# Patient Record
Sex: Female | Born: 1975 | Race: Black or African American | Hispanic: No | Smoking: Never smoker
Health system: Southern US, Community
[De-identification: ages and names within clinical notes are randomized; demographics above are authoritative.]

## PROBLEM LIST (undated history)

## (undated) DIAGNOSIS — Z9889 Other specified postprocedural states: Secondary | ICD-10-CM

## (undated) DIAGNOSIS — K219 Gastro-esophageal reflux disease without esophagitis: Secondary | ICD-10-CM

## (undated) DIAGNOSIS — R569 Unspecified convulsions: Secondary | ICD-10-CM

## (undated) DIAGNOSIS — R112 Nausea with vomiting, unspecified: Secondary | ICD-10-CM

---

## 1998-10-19 HISTORY — PX: TUBAL LIGATION: SHX77

## 2001-12-19 ENCOUNTER — Encounter: Payer: Self-pay | Admitting: Emergency Medicine

## 2001-12-19 ENCOUNTER — Emergency Department (HOSPITAL_COMMUNITY): Admission: EM | Admit: 2001-12-19 | Discharge: 2001-12-19 | Payer: Self-pay | Admitting: Emergency Medicine

## 2002-04-06 ENCOUNTER — Emergency Department (HOSPITAL_COMMUNITY): Admission: EM | Admit: 2002-04-06 | Discharge: 2002-04-06 | Payer: Self-pay | Admitting: Emergency Medicine

## 2002-06-23 ENCOUNTER — Emergency Department (HOSPITAL_COMMUNITY): Admission: EM | Admit: 2002-06-23 | Discharge: 2002-06-24 | Payer: Self-pay

## 2002-11-06 ENCOUNTER — Other Ambulatory Visit: Admission: RE | Admit: 2002-11-06 | Discharge: 2002-11-06 | Payer: Self-pay | Admitting: Family Medicine

## 2003-02-04 ENCOUNTER — Inpatient Hospital Stay (HOSPITAL_COMMUNITY): Admission: EM | Admit: 2003-02-04 | Discharge: 2003-02-05 | Payer: Self-pay

## 2003-03-01 ENCOUNTER — Emergency Department (HOSPITAL_COMMUNITY): Admission: EM | Admit: 2003-03-01 | Discharge: 2003-03-01 | Payer: Self-pay | Admitting: Emergency Medicine

## 2003-04-09 ENCOUNTER — Encounter (INDEPENDENT_AMBULATORY_CARE_PROVIDER_SITE_OTHER): Payer: Self-pay | Admitting: *Deleted

## 2003-04-09 ENCOUNTER — Ambulatory Visit (HOSPITAL_BASED_OUTPATIENT_CLINIC_OR_DEPARTMENT_OTHER): Admission: RE | Admit: 2003-04-09 | Discharge: 2003-04-09 | Payer: Self-pay | Admitting: Otolaryngology

## 2003-07-23 ENCOUNTER — Emergency Department (HOSPITAL_COMMUNITY): Admission: EM | Admit: 2003-07-23 | Discharge: 2003-07-24 | Payer: Self-pay | Admitting: Emergency Medicine

## 2003-07-23 ENCOUNTER — Encounter: Payer: Self-pay | Admitting: Emergency Medicine

## 2003-11-05 ENCOUNTER — Ambulatory Visit (HOSPITAL_COMMUNITY): Admission: RE | Admit: 2003-11-05 | Discharge: 2003-11-05 | Payer: Self-pay | Admitting: Family Medicine

## 2004-10-10 ENCOUNTER — Emergency Department (HOSPITAL_COMMUNITY): Admission: EM | Admit: 2004-10-10 | Discharge: 2004-10-11 | Payer: Self-pay | Admitting: Emergency Medicine

## 2004-11-04 ENCOUNTER — Emergency Department (HOSPITAL_COMMUNITY): Admission: EM | Admit: 2004-11-04 | Discharge: 2004-11-04 | Payer: Self-pay | Admitting: Emergency Medicine

## 2004-11-12 ENCOUNTER — Ambulatory Visit: Admission: RE | Admit: 2004-11-12 | Discharge: 2004-11-12 | Payer: Self-pay | Admitting: Family Medicine

## 2004-12-10 ENCOUNTER — Emergency Department (HOSPITAL_COMMUNITY): Admission: EM | Admit: 2004-12-10 | Discharge: 2004-12-10 | Payer: Self-pay | Admitting: Emergency Medicine

## 2005-01-25 ENCOUNTER — Emergency Department (HOSPITAL_COMMUNITY): Admission: EM | Admit: 2005-01-25 | Discharge: 2005-01-25 | Payer: Self-pay | Admitting: Emergency Medicine

## 2005-03-20 ENCOUNTER — Inpatient Hospital Stay (HOSPITAL_COMMUNITY): Admission: AD | Admit: 2005-03-20 | Discharge: 2005-03-20 | Payer: Self-pay | Admitting: Obstetrics and Gynecology

## 2005-04-09 ENCOUNTER — Encounter (INDEPENDENT_AMBULATORY_CARE_PROVIDER_SITE_OTHER): Payer: Self-pay | Admitting: Specialist

## 2005-04-09 ENCOUNTER — Ambulatory Visit (HOSPITAL_COMMUNITY): Admission: RE | Admit: 2005-04-09 | Discharge: 2005-04-09 | Payer: Self-pay | Admitting: Obstetrics and Gynecology

## 2005-04-14 ENCOUNTER — Inpatient Hospital Stay (HOSPITAL_COMMUNITY): Admission: AD | Admit: 2005-04-14 | Discharge: 2005-04-14 | Payer: Self-pay | Admitting: Obstetrics and Gynecology

## 2006-01-28 ENCOUNTER — Ambulatory Visit (HOSPITAL_COMMUNITY): Admission: RE | Admit: 2006-01-28 | Discharge: 2006-01-28 | Payer: Self-pay | Admitting: Neurology

## 2006-02-04 ENCOUNTER — Emergency Department (HOSPITAL_COMMUNITY): Admission: EM | Admit: 2006-02-04 | Discharge: 2006-02-04 | Payer: Self-pay | Admitting: Emergency Medicine

## 2006-05-18 ENCOUNTER — Emergency Department (HOSPITAL_COMMUNITY): Admission: EM | Admit: 2006-05-18 | Discharge: 2006-05-18 | Payer: Self-pay | Admitting: Emergency Medicine

## 2006-10-19 HISTORY — PX: BRAIN SURGERY: SHX531

## 2006-11-26 ENCOUNTER — Ambulatory Visit (HOSPITAL_COMMUNITY): Admission: RE | Admit: 2006-11-26 | Discharge: 2006-11-26 | Payer: Self-pay | Admitting: Emergency Medicine

## 2006-12-31 ENCOUNTER — Encounter: Admission: RE | Admit: 2006-12-31 | Discharge: 2007-01-27 | Payer: Self-pay | Admitting: Psychiatry

## 2007-03-27 ENCOUNTER — Emergency Department (HOSPITAL_COMMUNITY): Admission: EM | Admit: 2007-03-27 | Discharge: 2007-03-28 | Payer: Self-pay | Admitting: Emergency Medicine

## 2007-04-08 ENCOUNTER — Emergency Department (HOSPITAL_COMMUNITY): Admission: EM | Admit: 2007-04-08 | Discharge: 2007-04-08 | Payer: Self-pay | Admitting: Emergency Medicine

## 2007-04-13 ENCOUNTER — Emergency Department (HOSPITAL_COMMUNITY): Admission: EM | Admit: 2007-04-13 | Discharge: 2007-04-13 | Payer: Self-pay | Admitting: Emergency Medicine

## 2007-05-22 IMAGING — CR DG ELBOW COMPLETE 3+V*L*
4 series · 4 of 4 positions shown · non-contrast
Comparison: None.

CLINICAL DATA: Fall after seizure.
 LEFT ELBOW ? 4 VIEW:

[view not recorded (1 of 4)]
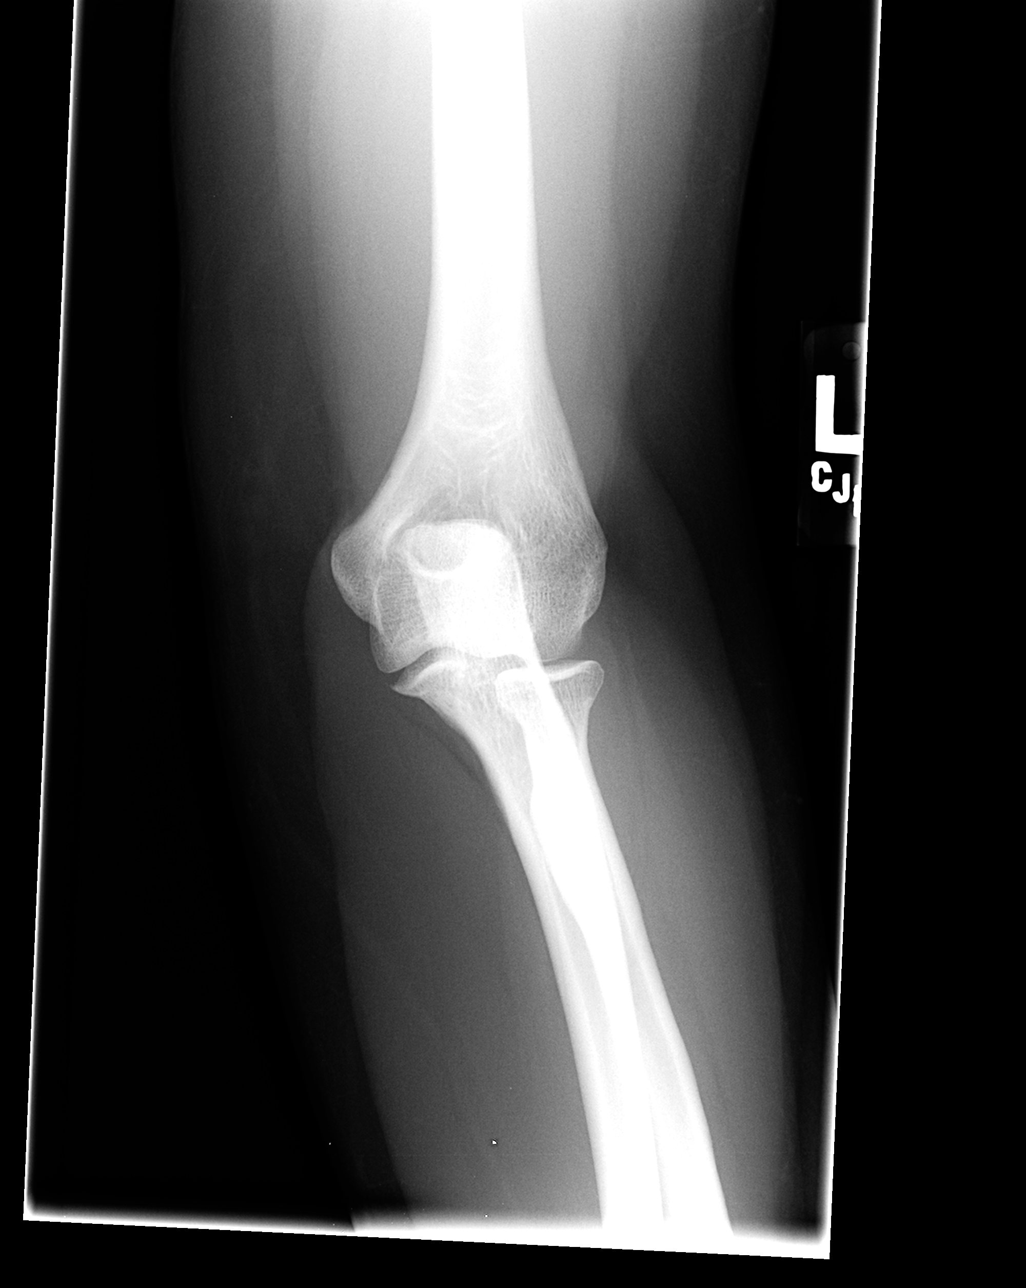

[view not recorded (2 of 4)]
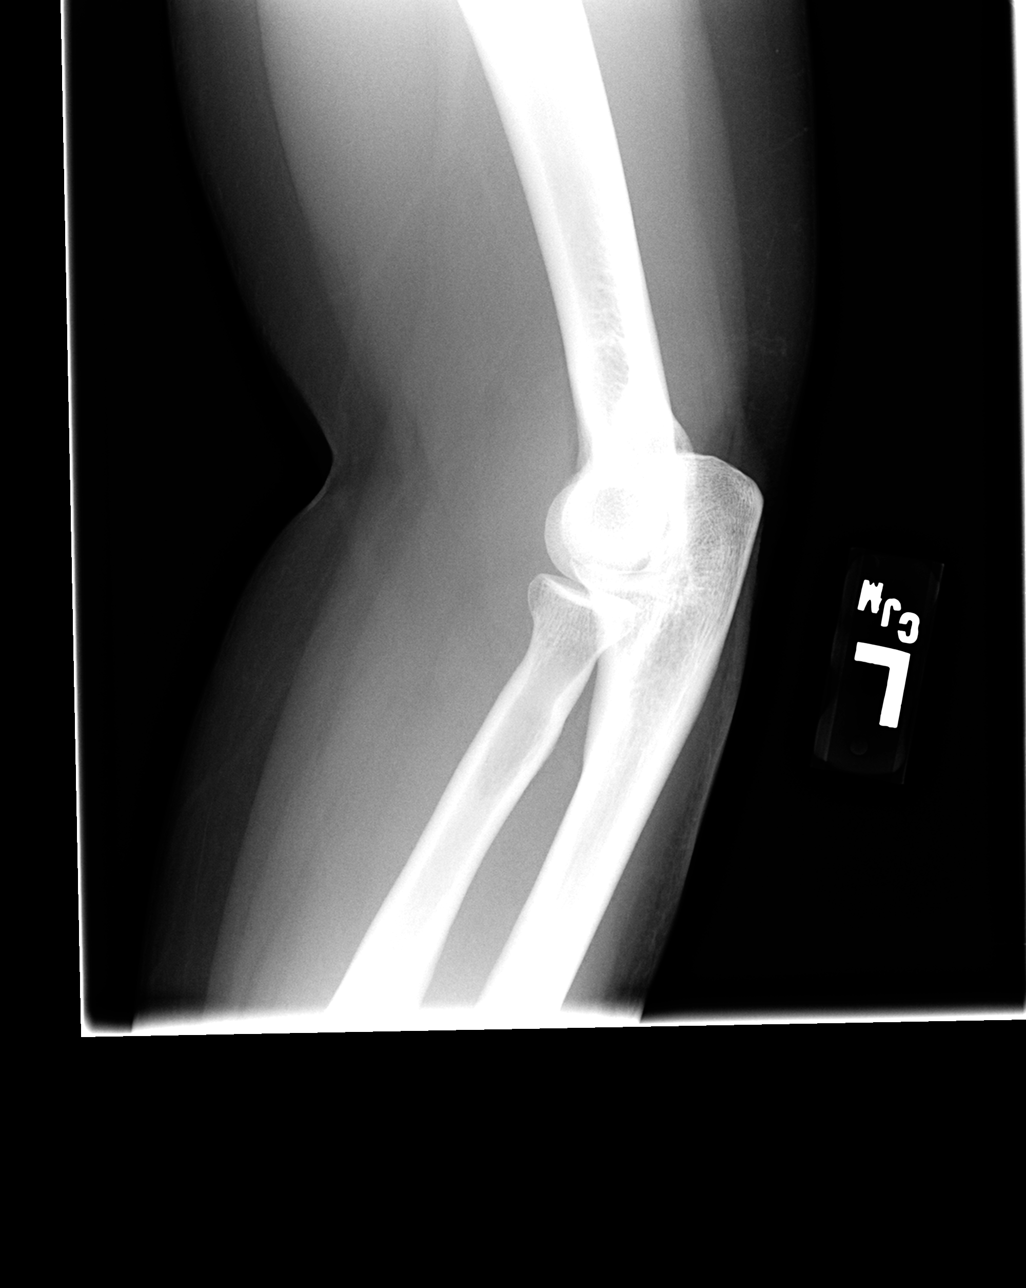

[view not recorded (3 of 4)]
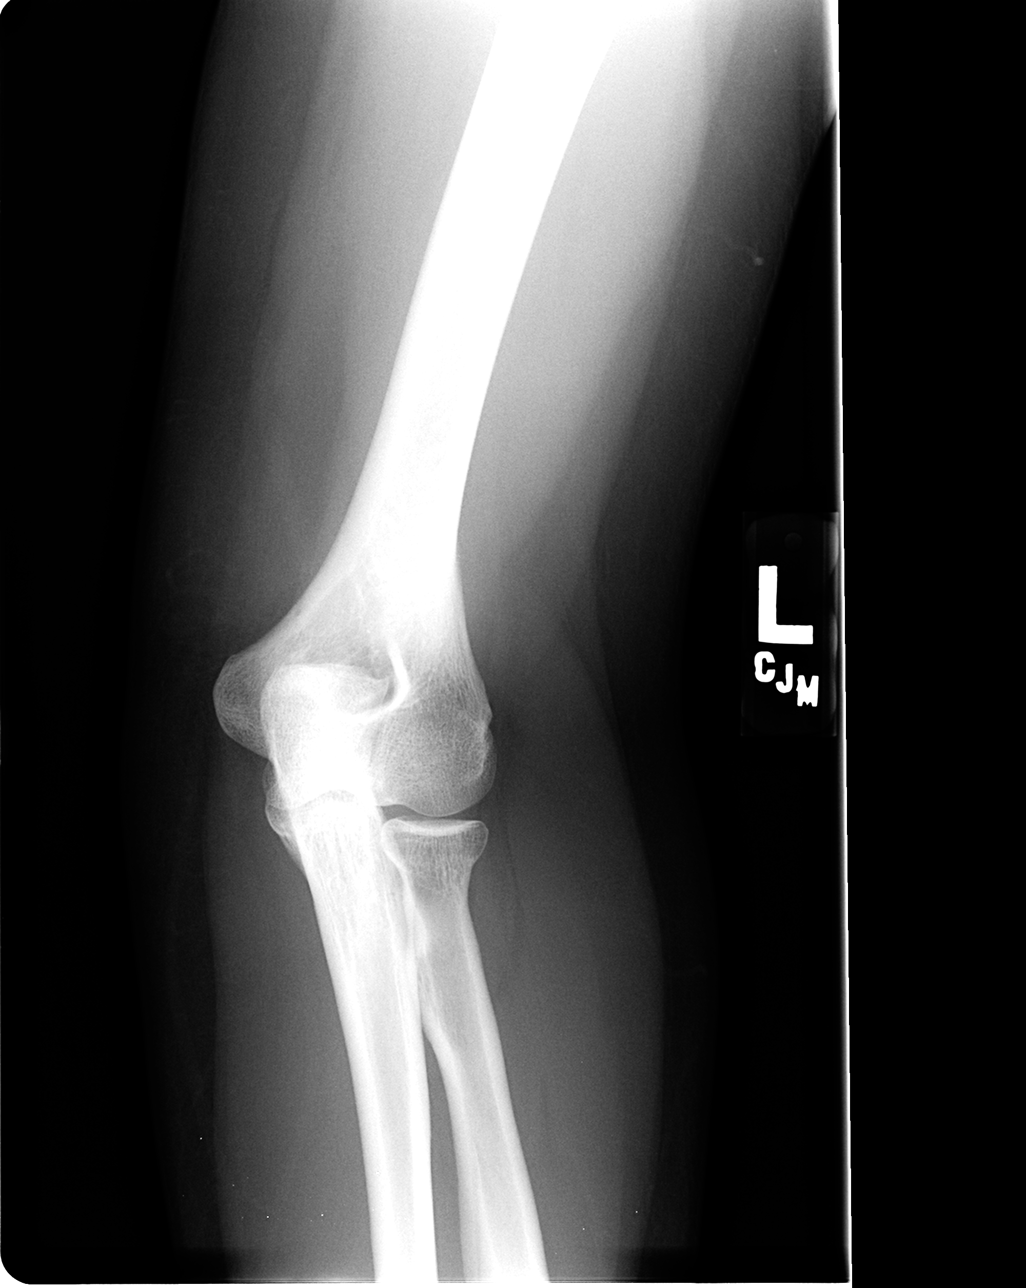

[view not recorded (4 of 4)]
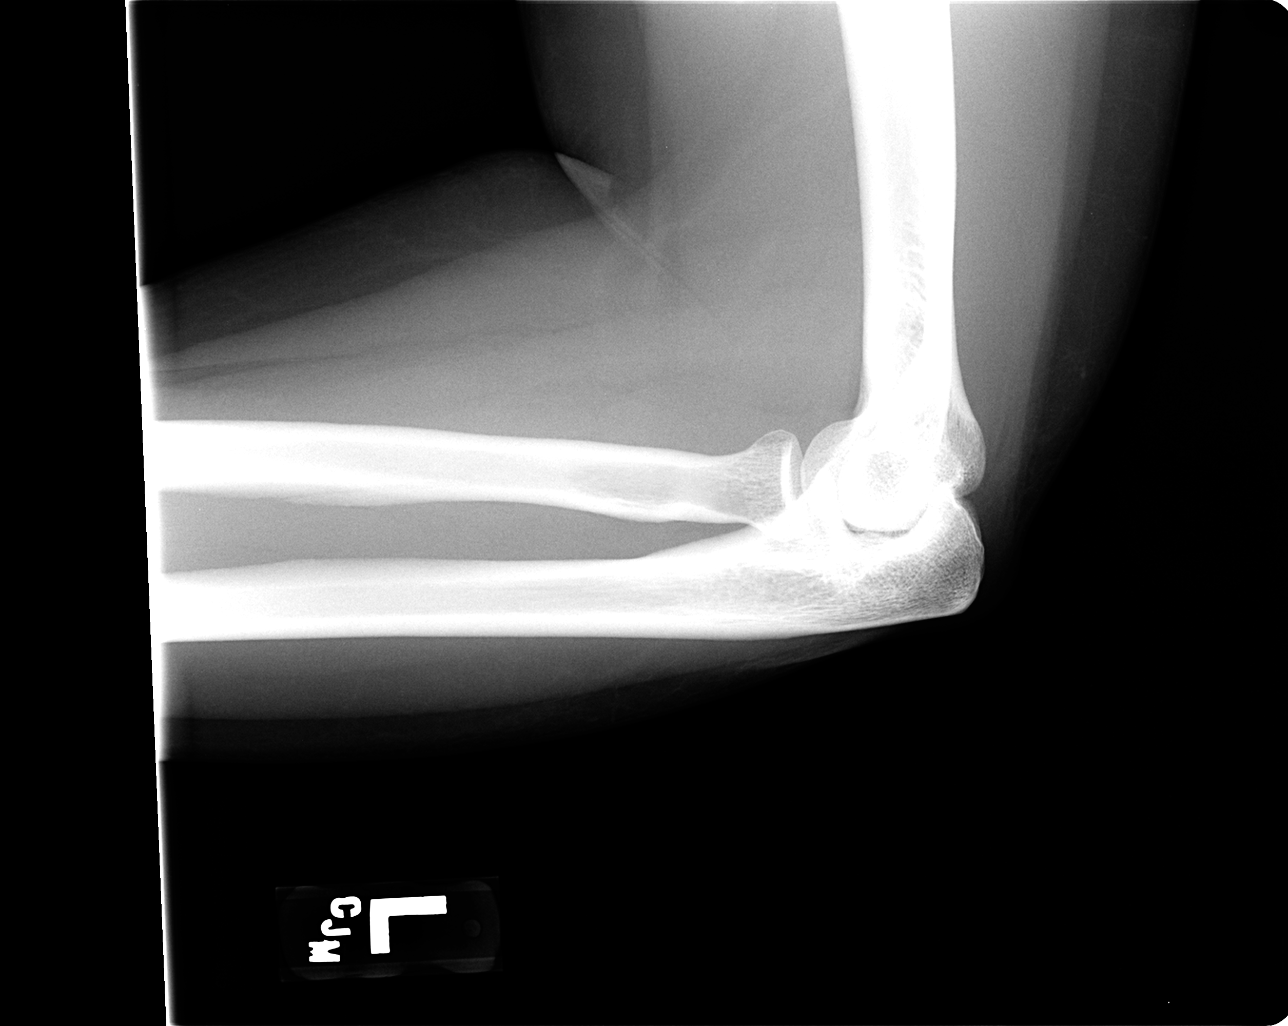

[4 of 4 positions shown; findings below may reference images not displayed]

FINDINGS: No joint effusion is identified.  
 There are no fractures or dislocations.
IMPRESSION: No acute findings.

## 2007-05-22 IMAGING — CR DG WRIST COMPLETE 3+V*L*
4 series · 4 of 4 positions shown · non-contrast
Comparison: none

CLINICAL DATA: Status post fall after a seizure.  Evaluate for fracture. 
LEFT WRIST - U4VYS:
COMPARISION: None

[view not recorded (1 of 4)]
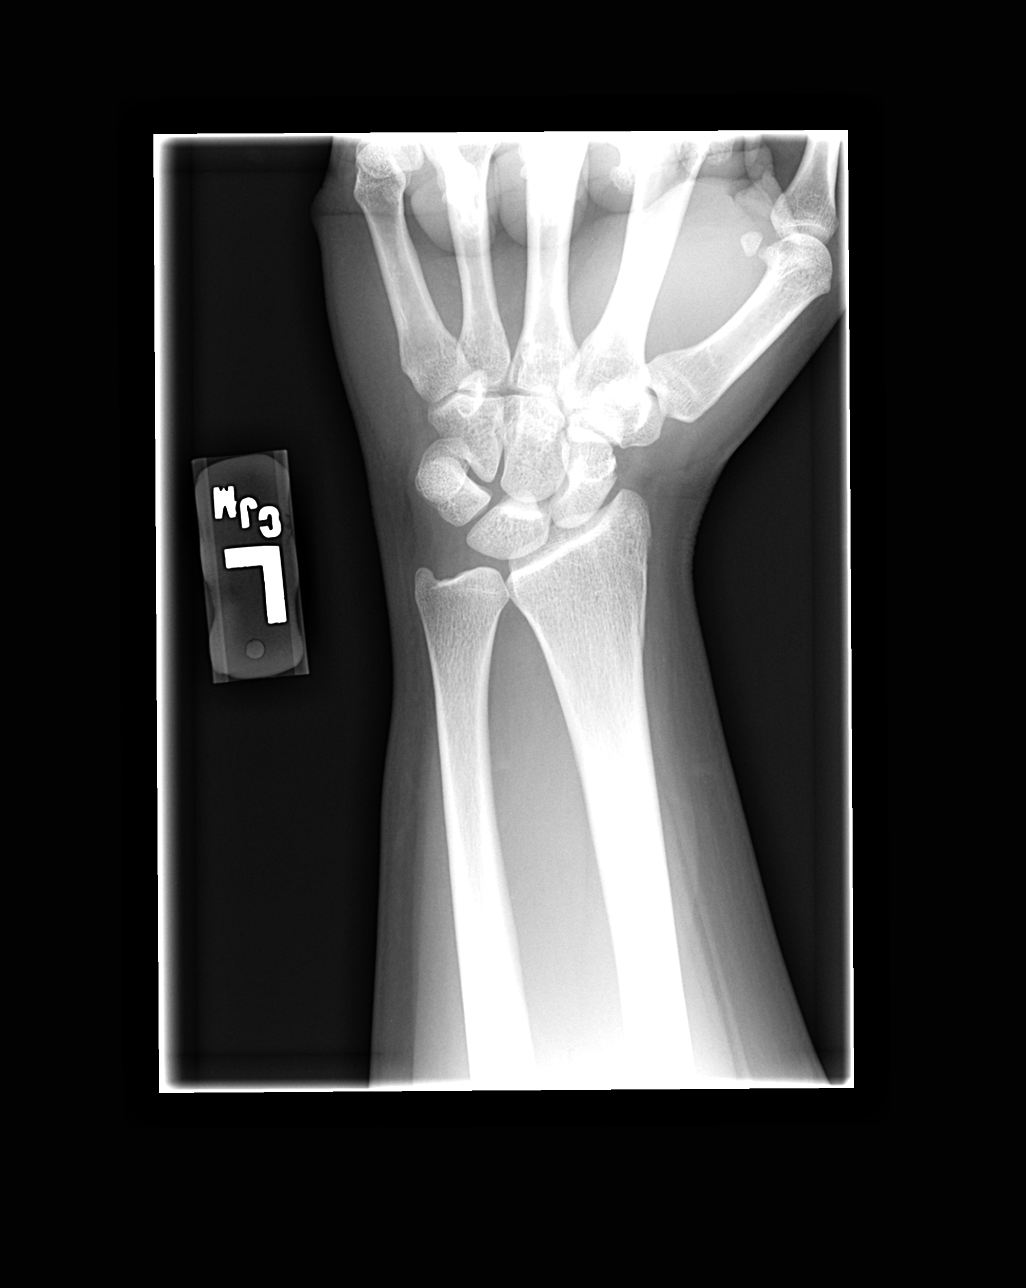

[view not recorded (2 of 4)]
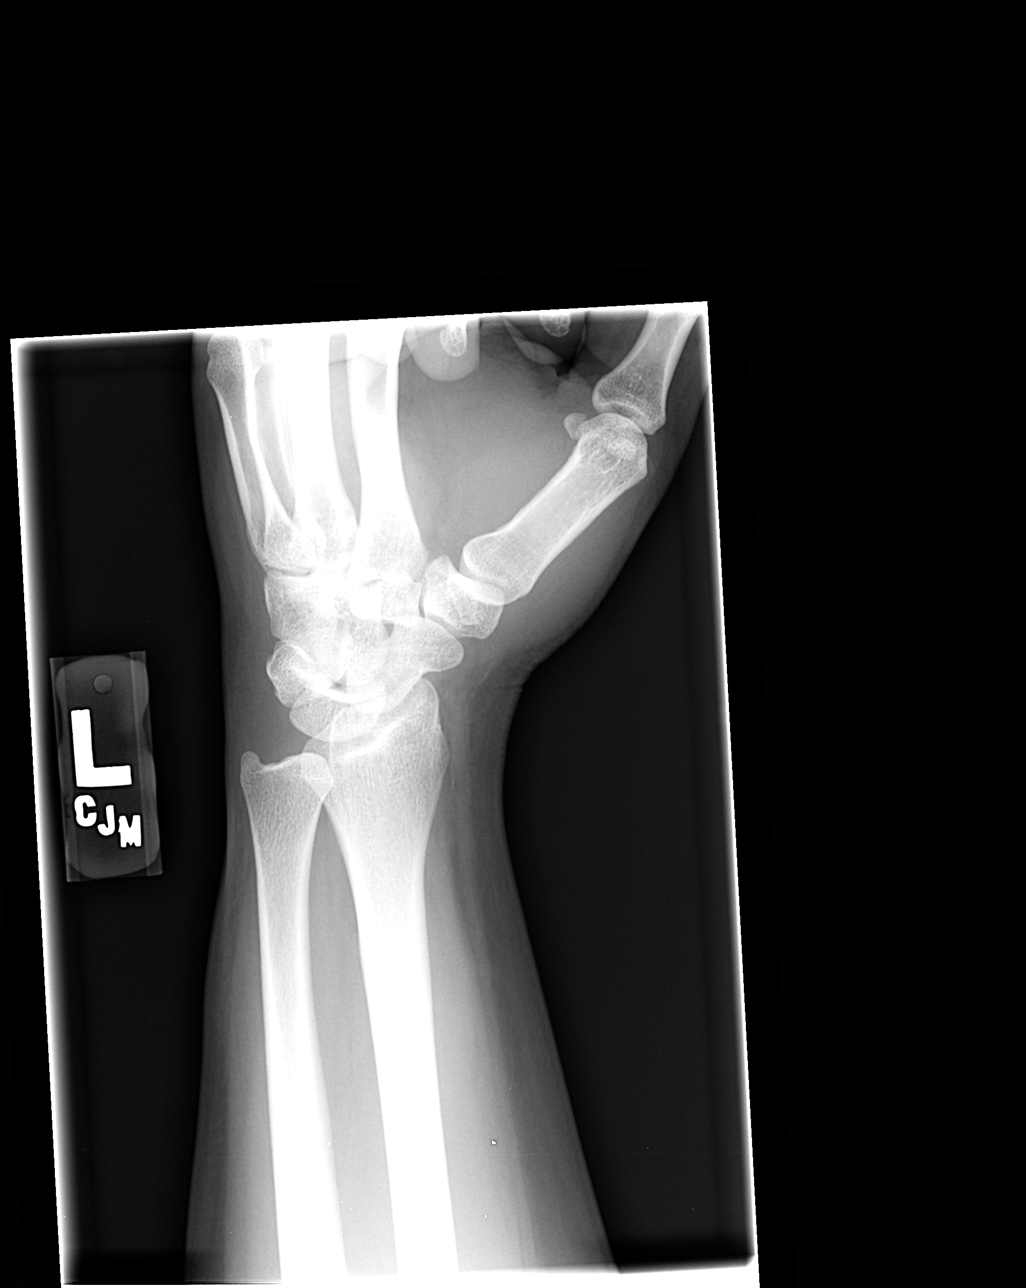

[view not recorded (3 of 4)]
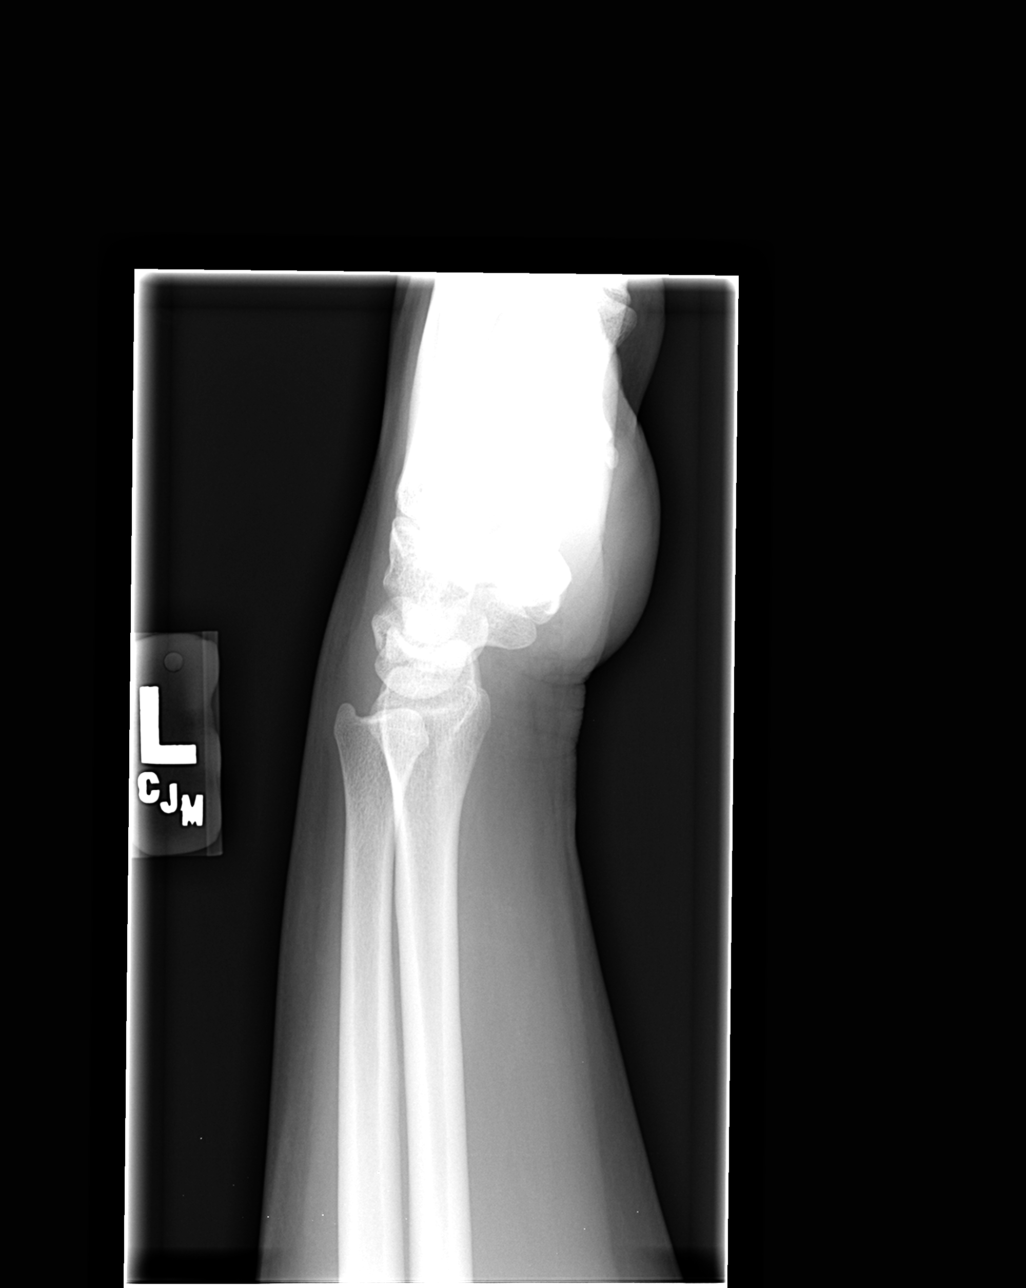

[view not recorded (4 of 4)]
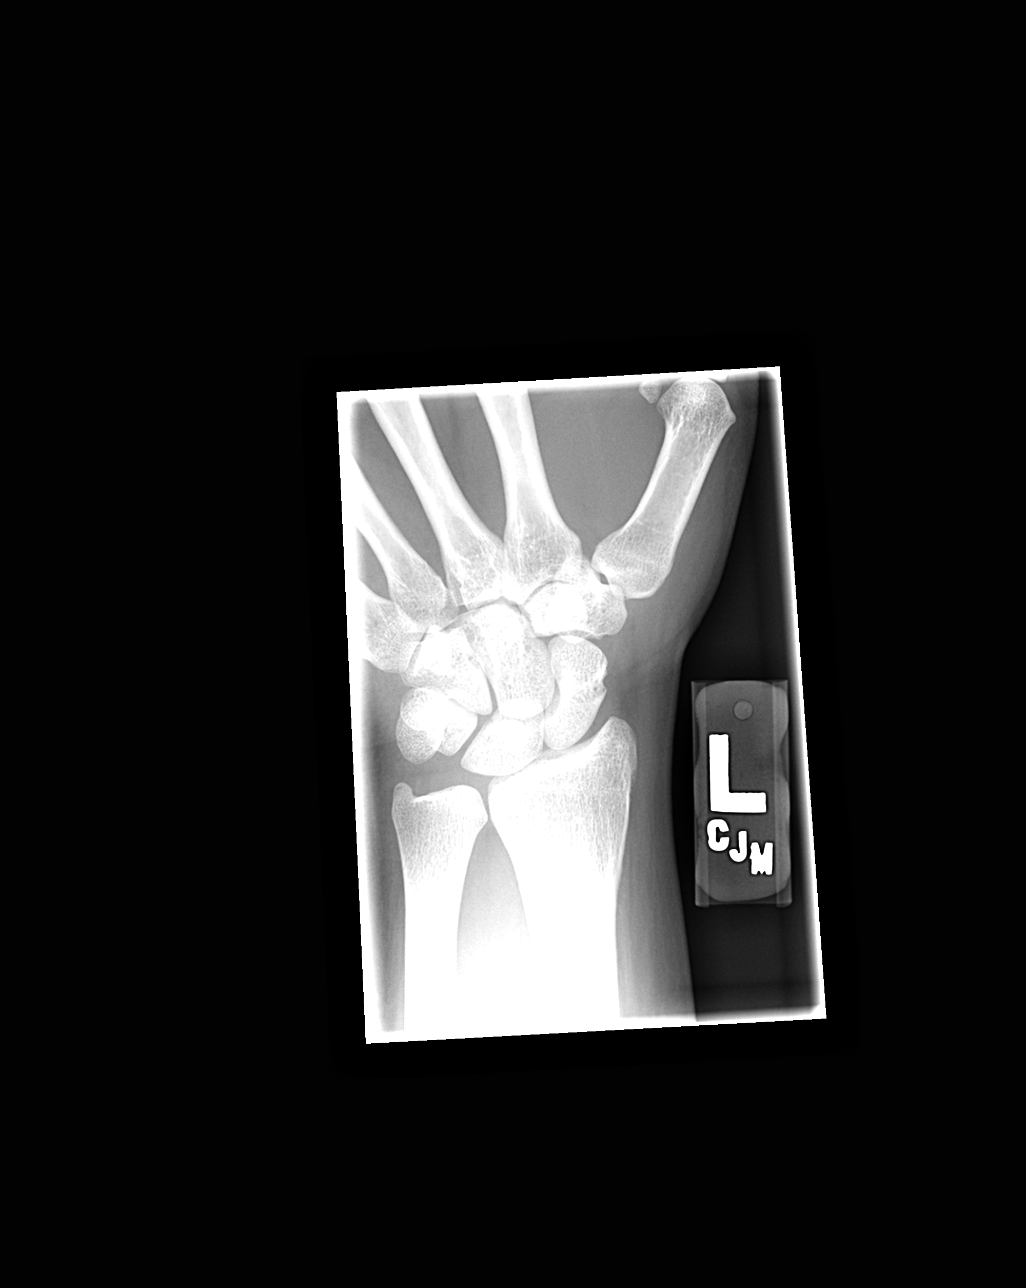

[4 of 4 positions shown; findings below may reference images not displayed]

FINDINGS: No fractures or dislocations are identified.
IMPRESSION: No acute findings.

## 2008-06-21 ENCOUNTER — Encounter: Admission: RE | Admit: 2008-06-21 | Discharge: 2008-08-09 | Payer: Self-pay | Admitting: Neurology

## 2009-09-01 ENCOUNTER — Emergency Department (HOSPITAL_COMMUNITY): Admission: EM | Admit: 2009-09-01 | Discharge: 2009-09-01 | Payer: Self-pay | Admitting: Emergency Medicine

## 2010-04-24 ENCOUNTER — Ambulatory Visit (HOSPITAL_COMMUNITY): Admission: RE | Admit: 2010-04-24 | Discharge: 2010-04-24 | Payer: Self-pay | Admitting: Obstetrics and Gynecology

## 2010-11-08 ENCOUNTER — Encounter: Payer: Self-pay | Admitting: Family Medicine

## 2010-11-09 ENCOUNTER — Encounter: Payer: Self-pay | Admitting: Emergency Medicine

## 2011-01-04 LAB — CBC
HCT: 34.5 % — ABNORMAL LOW (ref 36.0–46.0)
MCH: 30.9 pg (ref 26.0–34.0)
RBC: 3.83 MIL/uL — ABNORMAL LOW (ref 3.87–5.11)
WBC: 5.9 10*3/uL (ref 4.0–10.5)

## 2011-01-21 LAB — URINALYSIS, ROUTINE W REFLEX MICROSCOPIC
Bilirubin Urine: NEGATIVE
Hgb urine dipstick: NEGATIVE
Ketones, ur: NEGATIVE mg/dL
Protein, ur: NEGATIVE mg/dL
Specific Gravity, Urine: 1.019 (ref 1.005–1.030)

## 2011-01-21 LAB — POCT I-STAT, CHEM 8
HCT: 36 % (ref 36.0–46.0)
Hemoglobin: 12.2 g/dL (ref 12.0–15.0)
Sodium: 138 mEq/L (ref 135–145)

## 2011-01-21 LAB — URINE MICROSCOPIC-ADD ON

## 2011-03-06 NOTE — Op Note (Signed)
NAME:  Leslie Zhang, Leslie Zhang                      ACCOUNT NO.:  [id number]   MEDICAL RECORD NO.:  [id number]                   PATIENT TYPE:  AMB   LOCATION:  DSC                                  FACILITY:  [facility]   PHYSICIAN:  Kinnie Scales. Annalee Genta, M.D.            DATE OF BIRTH:  1975/11/02   DATE OF PROCEDURE:  04/09/2003  DATE OF DISCHARGE:                                 OPERATIVE REPORT   PREOPERATIVE DIAGNOSES:  1. Recurrent acute tonsillitis.  2. History of peritonsillar abscess.   POSTOPERATIVE DIAGNOSES:  1. Recurrent acute tonsillitis.  2. History of peritonsillar abscess.   INDICATIONS FOR PROCEDURE:  1. Recurrent acute tonsillitis.  2. History of peritonsillar abscess.   SURGICAL PROCEDURE:  Tonsillectomy.   ANESTHESIA:  General endotracheal.   COMPLICATIONS:  None.   ESTIMATED BLOOD LOSS:  Minimal.   CONDITION ON DISCHARGE:  The patient transferred from the operating room to  the recovery room in stable condition.   BRIEF HISTORY:  The patient is a 35 year old black female who is referred  from the [facility] Department for evaluation of  recurrent tonsillitis.  The patient had a history of significant tonsillar  infections requiring hospitalization twice for intravenous antibiotics and  hydration.  The patient has had a history of significant recurrent  infections and chronic symptoms of low grade sore throat and tonsillar  discharge.  Due to the patient's history, examination and findings, I  recommended to undertake tonsillectomy under general anesthesia.  The risks  and benefits and possible complications of the procedure were discussed in  detail with the patient and her family who understood and concurred with our  plan for surgery which was scheduled for 04/09/03.   DESCRIPTION OF PROCEDURE:  The patient was brought to the operating room on  04/09/03 and placed in the supine position on the operating room table.  General endotracheal  anesthesia was induced without difficulty.  When the  patient had been adequately anesthetized, a Clevis mouth gag was inserted  and the oral cavity and oropharynx were examined.  The patient's hard and  soft palate were intact.  The patient had significant bilateral tonsillary  hypertrophy with chronic cryptic tonsillar changes.  Beginning on the  patient's left hand side using a harmonics scalpel and dissecting in a  subcapsular fashion, the entire left tonsil was resected from the superior  pole to the tongue base.  The right tonsil was removed in a similar fashion.  The tonsillar tissue was sent to the laboratory for gross and microscopic  evaluation.  The tonsillar fossa were gently abraded with the dry tonsil  sponge and several areas of point hemorrhage were cauterized using suction  cautery.  The clevis mouth gag was released and re-applied.  There was no  active bleeding.  The orogastric tube was passed and the stomach contents  were aspirated.  The clevis mouth gag was released and removed.  There  were  no loose or broken teeth and no bleeding.  The patient was then awakened  from her anesthetic.  She was extubated, was transferred from the operating  room to the recovery room in stable condition.   COMPLICATIONS:  None.   ESTIMATED BLOOD LOSS:  Minimal.                                                David L. Annalee Genta, M.D.    DLS/MEDQ  D:  10/21/7251  T:  04/09/2003  Job:  [id number]

## 2011-03-06 NOTE — Discharge Summary (Signed)
NAME:  Leslie Zhang, Leslie Zhang NO.:  [id number]   MEDICAL RECORD NO.:  [id number]                   PATIENT TYPE:  INP   LOCATION:  0467                                 FACILITY:  [facility]   PHYSICIAN:  Lazaro Arms, M.D.        DATE OF BIRTH:  [birth year]   DATE OF ADMISSION:  02/04/2003  DATE OF DISCHARGE:                                 DISCHARGE SUMMARY   PRIMARY CARE PHYSICIAN:  Dr. Daphine Deutscher.  Phone number is [phone].  Fax number  unknown.   DISCHARGE DIAGNOSES:  1. Strep pharyngitis, recurrent.  2. Status post dehydration, now resolved.   HISTORY OF PRESENT ILLNESS:  The patient was in her usual state of health  until approximately four days prior to admission when she began to have a  sore throat and fever.  This worsened progressively and then she became  unable to swallow anything including her own secretions.  She also admitted  to nausea, vomiting, and unable to take p.o.  She did state her voice  sounded funny the previous two days prior to admission.  All of this is  similar to her other three episodes of Strep pharyngitis which she has had  over the past several years.  Most recently she had one in September of 2003  where she had to come to the emergency room for rehydration.  She has no  other medical problems.   ALLERGIES:  She has no allergies.   SOCIAL HISTORY:  She does live with her two children and is currently  unemployed.   FAMILY HISTORY:  Significant for mom with hypertension.   HOSPITAL COURSE:  Initially on presentation she was dehydrated with a pulse  of 138 and temperature was 99.4.  She was admitted to the emergency room,  gave her a shot of benzylpenicillin, gave her a shot of IV Decadron and some  fluids and she was admitted because she could no longer keep down p.o.  despite the above measures.  We continued attention to IV Clindamycin and  watched her closely, as we were initially worried that she might be  developing an abscess.  However, she improved dramatically over the next 24  hours and she was able to swallow.  The voice returned to normal and she at  no point had any trismus, and she was discharged home in stable condition on  February 05, 2003.  She had taken a full lunch and had been able to drink and  was feeling markedly improved.   PHYSICAL EXAMINATION:  VITAL SIGNS:  Pulse was 80, blood pressure was  119/67.  GENERAL:  Her exam was normal.  HEENT:  Her tonsils were still enlarged, although they were perhaps more so  than before.   DISCHARGE PLAN:   FOLLOW UP:  She is to follow up with her primary care doctor.  She has an  appointment tomorrow with Dr. Daphine Deutscher.    DISCHARGE MEDICATIONS:  She was given  a prescription for __________10 day  course of antibiotics.  Augmentin was chosen as she did have a very tender  lymph node.  Her primary care doctor should follow up that with __________to  make sure this resolves.  In addition the patient was counseled to ask her  primary care doctor about the pros and cons of tonsillectomy, as she has had  recurrent pharyngitis which is rather severe.                                                  Lazaro Arms, M.D.    AMC/MEDQ  D:  02/05/2003  T:  02/05/2003  Job:  [id number]   cc:   Dr. Daphine Deutscher

## 2011-03-06 NOTE — Op Note (Signed)
NAMEARELY, TINNER NO.:  [id number]   MEDICAL RECORD NO.:  [id number]          PATIENT TYPE:  AMB   LOCATION:  SDC                           FACILITY:  [facility]   PHYSICIAN:  Maxie Better, M.D.DATE OF BIRTH:  1976-03-30   DATE OF PROCEDURE:  04/09/2005  DATE OF DISCHARGE:                                 OPERATIVE REPORT   PREOPERATIVE DIAGNOSES:  1.  Chronic left lower quadrant pain.  2.  Menorrhagia.  3.  Endometrial mass.   PROCEDURE:  1.  Diagnostic laparoscopy.  2.  Diagnostic hysteroscopy.  3.  Dilation and curettage.   POSTOPERATIVE DIAGNOSES:  1.  Chronic left lower quadrant pain.  2.  Menorrhagia.  3.  Endometrial mass.   ANESTHESIA:  General, paracervical block.   SURGEON:  Maxie Better, M.D.   ASSISTANTS:  None.   DESCRIPTION OF PROCEDURE:  Under adequate general anesthesia, the patient  was placed in the dorsolithotomy position.  She was sterilely prepped and  draped in the usual fashion.  Indwelling Foley catheter is sterilely placed.  Examination under anesthesia reveals bulky, retroverted, retroflexed uterus.  No adnexal masses could be appreciated.  A bivalve speculum was placed in  the vagina.  Single-tooth tenaculum was placed on the anterior lip of the  cervix.  Then 10 mL of 1% Nesacaine was injected paracervically.  The cervix  was then serially dilated up to a #31 Pratt dilator.  A sorbitol primed  resectoscope was introduced into the uterine cavity without incident.  Both  tubal ostia could be seen.  The endometrium was thickened, in particular  posteriorly and quite irregular circumferentially.  The posterior thickening  was resected.  The resectoscope was then removed.  The cavity was then  curetted of a large amount of tissue.  The resectoscope was then reinserted,  cavity inspected, additional small areas of prominence and thickening was  removed.  The resectoscope was then removed.  The endometrial cavity was  then curetted.  An acorn cannula was introduced into the cervical os and  attached to the tenaculum for manipulation of the uterus.  A bivalve  speculum was removed.  Attention was then turned to the abdomen.  Marcaine  0.25% was injected infraumbilically.  Infraumbilical incision was then made.  The Veress needle was inserted and tested with normal saline.  With  placement, 3 L of CO2 was insufflated.  The Veress needle was removed, and  10 mm disposable trocar was introduced into the incision without incident.  A lighted video laparoscope was then utilized to inspect the abdomen and  pelvis.  The patient was placed in Trendelenburg.  A suprapubic incision was  then made under direct visualization.  A 5 mm port was placed.  The probe  was then utilized to inspect the pelvis.  The uterus was bulky, retroverted.  Some yellowish, clear fluid was in the posterior cul-de-sac.  Otherwise, the  cul-de-sac was without any evidence of endometriosis.  The left tube was  noted to have previous evidence of tubal ligation.  The left ovary was  normal.  No adhesions were noted.  There were  a large amount of varicosities  in the left ovarian vessel area, much more prominent than on the right.  The  right tube also had a surgical separation.  The right ovary was normal.  The  left ovary appeared to be with a corpus luteal cyst.  The appendix was not  fully visualized.  The right upper quadrant was normal.  The procedure was  terminated by removing the suprapubic port under direct visualization.  The  abdomen was deflated.  The infraumbilical port was removed, taking care not  to bring up an underlying structure.  The fascia was identified and closed  with 0 Vicryl of figure-of-eight suture, and the skin was approximated  infraumbilically with 4-0 Vicryl subcuticular stitch.  The suprapubic area  was approximated using Dermabond.  All instruments were then removed from  the vagina.  The specimen was  endometrial curettings sent to pathology.  Estimated blood loss was minimal.  Complication was none.  The patient  tolerated the procedure well and was transferred to recovery in stable  condition.       [place]/MEDQ  D:  04/09/2005  T:  04/09/2005  Job:  [id number]

## 2011-08-05 ENCOUNTER — Encounter (HOSPITAL_COMMUNITY): Payer: Self-pay

## 2011-08-05 ENCOUNTER — Inpatient Hospital Stay (HOSPITAL_COMMUNITY): Payer: Medicare Other

## 2011-08-05 ENCOUNTER — Inpatient Hospital Stay (HOSPITAL_COMMUNITY)
Admission: AD | Admit: 2011-08-05 | Discharge: 2011-08-05 | Disposition: A | Discharge status: Home / Self Care | Payer: Medicare Other | Source: Ambulatory Visit | Attending: Obstetrics and Gynecology | Admitting: Obstetrics and Gynecology

## 2011-08-05 DIAGNOSIS — R1032 Left lower quadrant pain: Secondary | ICD-10-CM | POA: Insufficient documentation

## 2011-08-05 DIAGNOSIS — N946 Dysmenorrhea, unspecified: Secondary | ICD-10-CM | POA: Insufficient documentation

## 2011-08-05 DIAGNOSIS — IMO0002 Reserved for concepts with insufficient information to code with codable children: Secondary | ICD-10-CM | POA: Insufficient documentation

## 2011-08-05 HISTORY — DX: Nausea with vomiting, unspecified: Z98.890

## 2011-08-05 HISTORY — DX: Unspecified convulsions: R56.9

## 2011-08-05 HISTORY — DX: Other specified postprocedural states: R11.2

## 2011-08-05 LAB — CBC
HCT: 34.7 % — ABNORMAL LOW (ref 36.0–46.0)
Hemoglobin: 11.5 g/dL — ABNORMAL LOW (ref 12.0–15.0)
MCH: 29.1 pg (ref 26.0–34.0)
MCV: 87.8 fL (ref 78.0–100.0)
Platelets: 346 10*3/uL (ref 150–400)
RDW: 12.2 % (ref 11.5–15.5)

## 2011-08-05 LAB — URINALYSIS, ROUTINE W REFLEX MICROSCOPIC
Nitrite: NEGATIVE
Protein, ur: NEGATIVE mg/dL
Specific Gravity, Urine: 1.02 (ref 1.005–1.030)

## 2011-08-05 NOTE — ED Provider Notes (Signed)
History   Chronic LLQ pain with exacebation  Chief Complaint  Patient presents with  . Abdominal Pain   HPI See Dictated note  Pertinent Gynecological History: Menses: flow is moderate Bleeding: nl Contraception: tubal ligation DES exposure: denies Blood transfusions: none Sexually transmitted diseases: no past history Previous GYN Procedures: see note dictated  Last mammogram: normal Date: na Last pap: normal Date: 2011   Past Medical History  Diagnosis Date  . Seizures     last sz 1 yr ago  . PONV (postoperative nausea and vomiting)     Past Surgical History  Procedure Date  . Tubal ligation 2000  . 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     Family History  Problem Relation Age of Onset  . Asthma Mother   . Hypertension Mother   . Asthma Brother   . Hypertension Brother   . Asthma Maternal Grandmother   . Diabetes Maternal Grandmother   . Hypertension Maternal Grandmother   . Diabetes Maternal Grandfather     History  Substance Use Topics  . Smoking status: Never Smoker   . Smokeless tobacco: Not on file  . Alcohol Use: No    Allergies:  Allergies  Allergen Reactions  . Contrast Media (Iodinated Diagnostic Agents) Itching and Rash    Severe itching  . Lamictal (Lamotrigine) Rash    Prescriptions prior to admission  Medication Sig Dispense Refill  . clobetasol (TEMOVATE) 0.05 % cream Apply 1 application topically 2 (two) times daily as needed. For rash       . esomeprazole (NEXIUM) 40 MG capsule Take 40 mg by mouth daily.        Marland Kitchen ibuprofen (ADVIL,MOTRIN) 800 MG tablet Take 800 mg by mouth every 8 (eight) hours as needed. For headache or pain       . levETIRAcetam (KEPPRA) 500 MG tablet Take 1,000 mg by mouth every 12 (twelve) hours.        . magnesium citrate 1.745 GM/30ML SOLN Take 296 mLs by mouth daily as needed. For constipation       . Multiple Vitamins-Minerals (HAIR/SKIN/NAILS PO) Take 1 tablet by mouth daily.        .  Oxcarbazepine (TRILEPTAL) 300 MG tablet Take 450 mg by mouth 2 (two) times daily. Take 1 and 1/2 tabs twice daily         ROS Physical ExamDictated   Blood pressure 128/79, pulse 77, temperature 98.6 F (37 C), temperature source Oral, resp. rate 16, height 5\' 7"  (1.702 m), weight 77.747 kg (171 lb 6.4 oz), last menstrual period 07/29/2011, SpO2 97.00%.  Physical Exam Dictated  MAU Course  Procedures  MDM na  Assessment and Plan  See Dictated note #[id number]  Rashaad Hallstrom J 08/05/2011, 8:07 PM [facility]

## 2011-08-05 NOTE — Progress Notes (Signed)
Pt states lower abdominal pain that is sharp & "reminds her of contractions", started 2 weeks ago. Comes & goes 3-4 times a day, lasts for a minute at a time. Denies vaginal bleeding or vaginal discharge. States the pain is more to the left side.

## 2011-08-06 LAB — DIFFERENTIAL
Basophils Absolute: 0
Basophils Relative: 1
Monocytes Absolute: 0.5
Neutro Abs: 3.1
Neutrophils Relative %: 50

## 2011-08-06 LAB — URINALYSIS, ROUTINE W REFLEX MICROSCOPIC
Bilirubin Urine: NEGATIVE
Leukocytes, UA: NEGATIVE
Nitrite: NEGATIVE
Specific Gravity, Urine: 1.023
Urobilinogen, UA: 1

## 2011-08-06 LAB — BASIC METABOLIC PANEL
BUN: 8
CO2: 25
Calcium: 9.1
Creatinine, Ser: 0.68
GFR calc non Af Amer: >60
Glucose, Bld: 104 — ABNORMAL HIGH

## 2011-08-06 LAB — POCT PREGNANCY, URINE
Operator id: 173591
Preg Test, Ur: NEGATIVE

## 2011-08-06 LAB — CBC
Hemoglobin: 12.7
MCHC: 34.3
Platelets: 364
RDW: 12

## 2011-08-06 LAB — URINE MICROSCOPIC-ADD ON

## 2011-08-06 NOTE — H&P (Signed)
NAMETIERRIA, WATSON NO.:  [id number]  MEDICAL RECORD NO.:  [id number]  LOCATION:  ZO10                          FACILITY:  [facility]  PHYSICIAN:  Lenoard Aden, M.D.DATE OF BIRTH:  08-Dec-1975  DATE OF ADMISSION:  08/05/2011 DATE OF DISCHARGE:  08/05/2011                             HISTORY & PHYSICAL   CHIEF COMPLAINT:  Pelvic pain.  HISTORY OF PRESENT ILLNESS:  She is a 35 year old African American female, G2, P2, with a 3-year history of chronic left lower quadrant pain, who presents with similar complaints this evening, reports that has been exacerbated over the last 2 weeks.  She reports dyspareunia and dysmenorrhea.  She denies any menstrual bleeding.  She denies possibility of pregnancy.  She has a history of tubal ligation.  She has allergies to SEIZURE MEDICATION, IVP DYE, and LAMICTAL.  She is a nonsmoker, nondrinker.  She denies domestic or physical violence.  She has a history of 2 vaginal deliveries by report, history of hysteroscopy in 2009, brain surgery in 2008 for temporal lobe.  Procedure; laparoscopy, which was negative in 2006, tubal ligation, D and C.  MEDICATIONS:  Include labetalol as needed, Trileptal, Keppra, and prenatal vitamins.  She has a family history of hypertension.  REVIEW OF SYSTEMS:  Otherwise negative.  The patient reports questionable constipation for which she took mag citrate with good relief.  She denies any urinary symptomatologies, but does have pelvic pressure as noted.  LABORATORY VALUES:  Today reveal a negative UPT, normal CBC, and negative urinalysis and an ultrasound which reveals a normal anteflexed uterus, small right parovarian cyst, but otherwise normal adnexal structures.  IMPRESSION:  Chronic left lower quadrant pain of unknown etiology.  The patient has been seen in [facility] at [facility] with negative etiology noted for her ongoing intermittent discomfort.  She has continued to  decline any surgical intervention.  She declines any pain medications at this time.  Workup today once again is negative.  Plan will be to discharge home.  She is to follow up in the office of Dr. Cherly Hensen in 1 week or sooner as needed.     Lenoard Aden, M.D.     RJT/MEDQ  D:  08/05/2011  T:  08/06/2011  Job:  [id number]

## 2012-11-26 IMAGING — US US PELVIS COMPLETE
1 series · 14 of 25 positions shown · non-contrast
Comparison: 11/26/2006

CLINICAL DATA: Abdominal pain

TRANSABDOMINAL AND TRANSVAGINAL ULTRASOUND OF PELVIS
TECHNIQUE: Both transabdominal and transvaginal ultrasound
examinations of the pelvis were performed. Transabdominal technique
was performed for global imaging of the pelvis including uterus,
ovaries, adnexal regions, and pelvic cul-de-sac.

[Series 1: us pelvis complete · 14 of 57 slices shown]
[im 1/57]
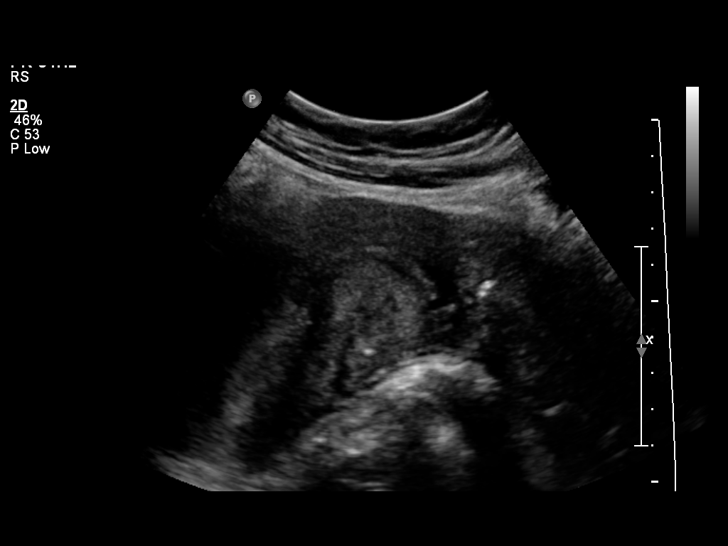
[im 5/57]
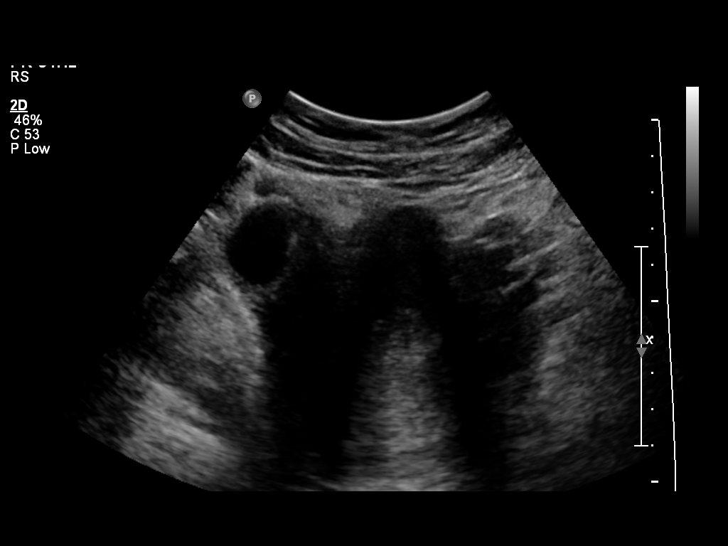
[im 10/57]
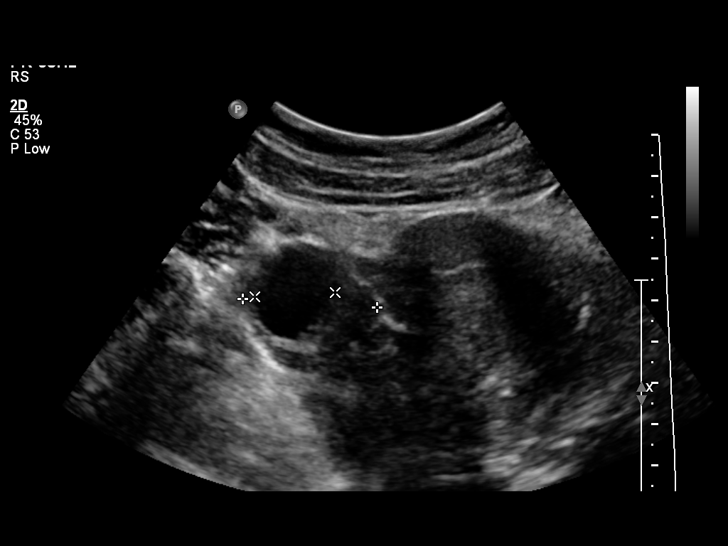
[im 15/57]
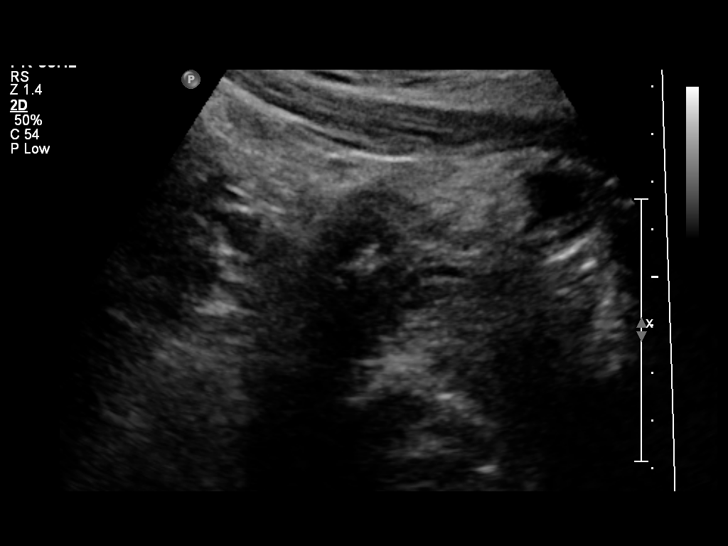
[im 19/57]
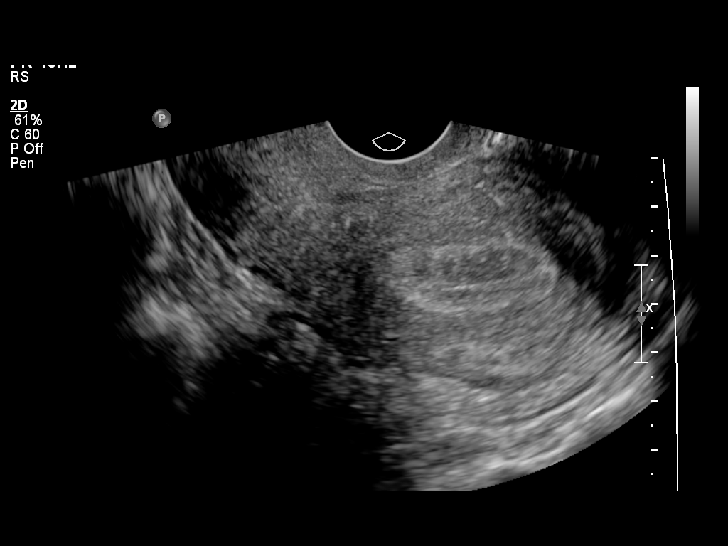
[im 22/57]
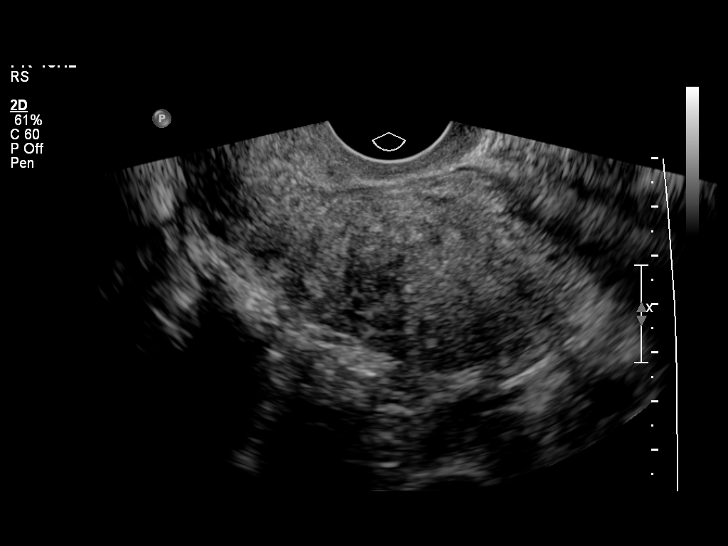
[im 26/57]
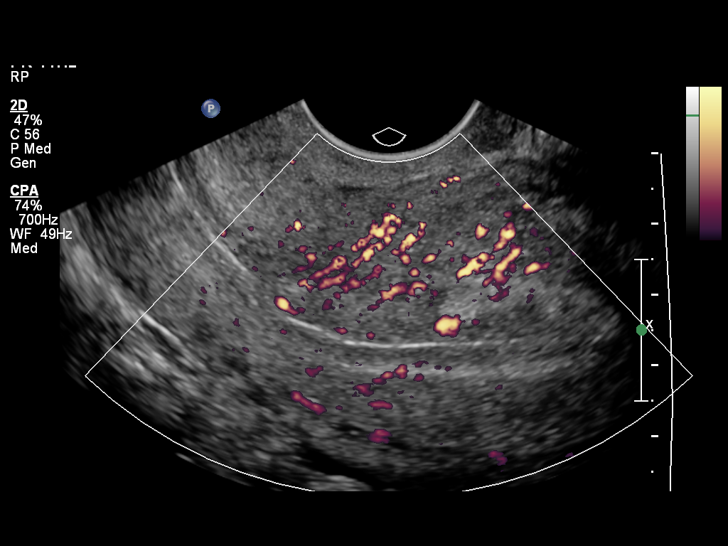
[im 31/57]
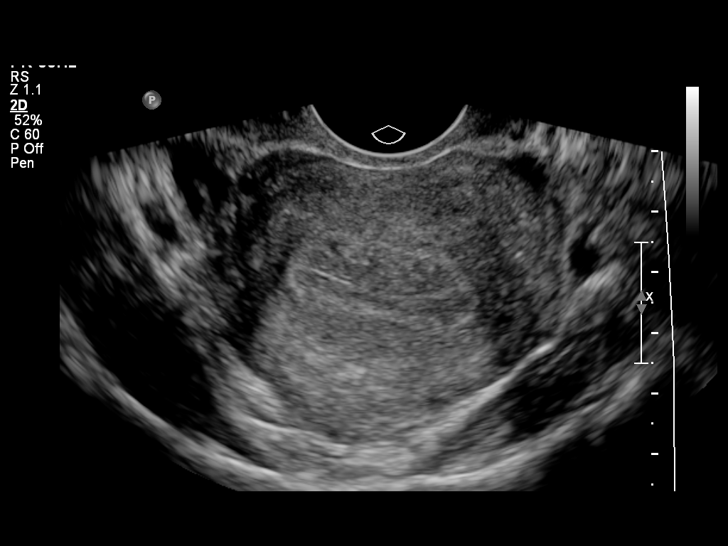
[im 36/57]
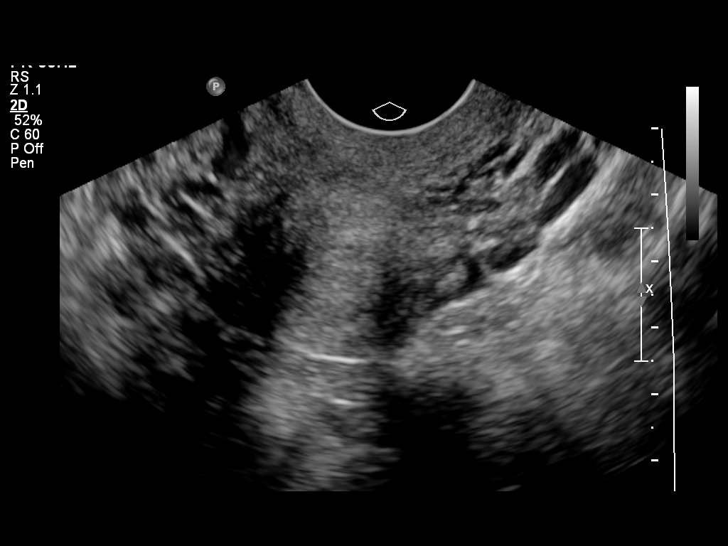
[im 38/57]
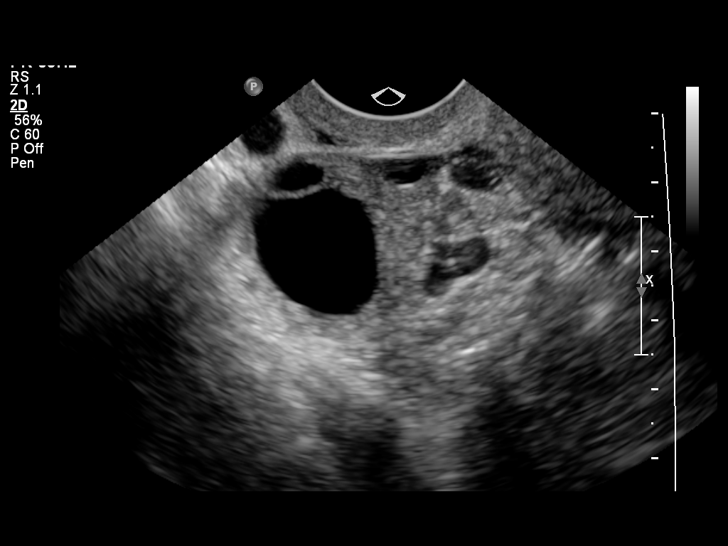
[im 43/57]
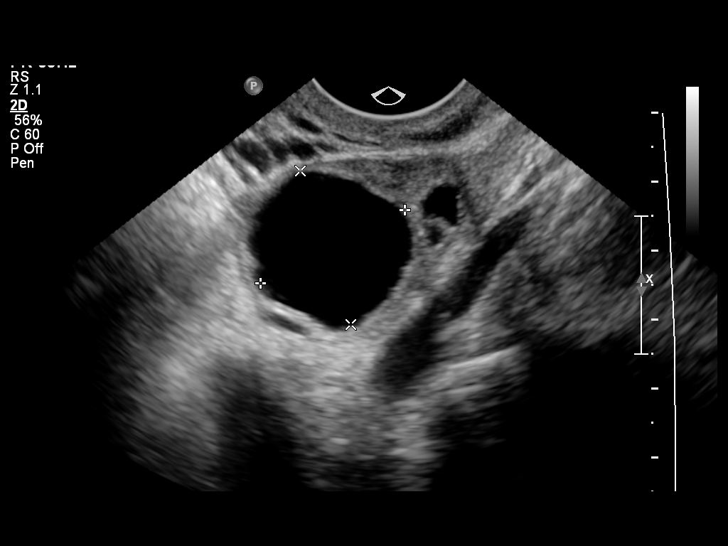
[im 47/57]
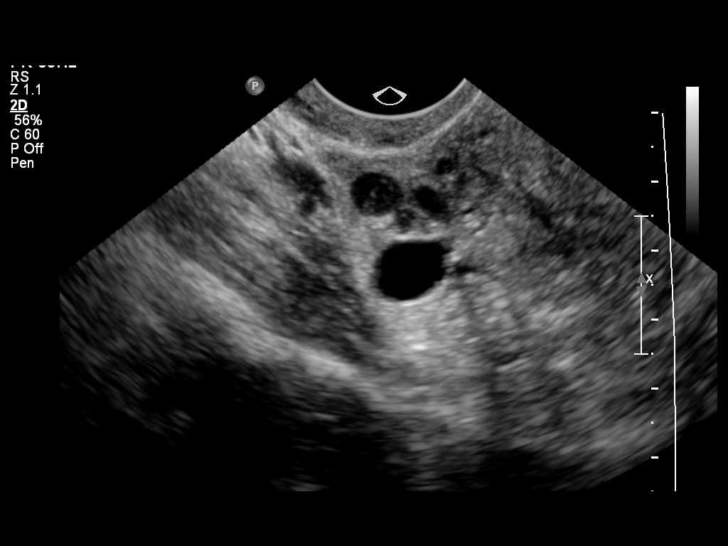
[im 52/57]
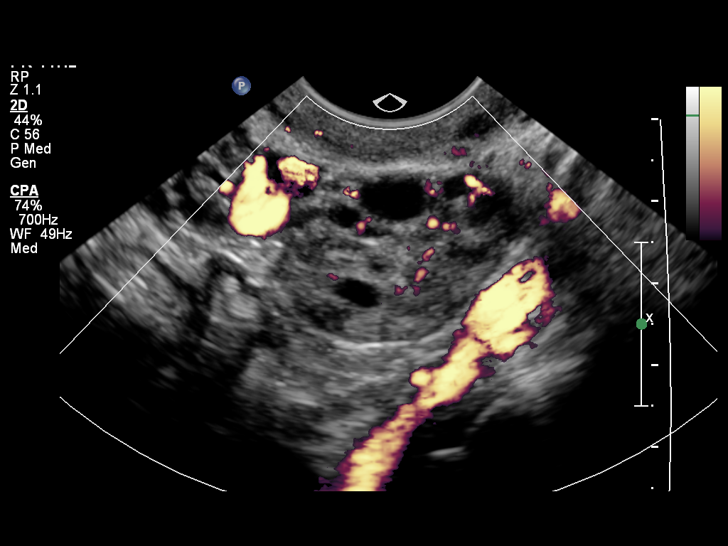
[im 57/57]
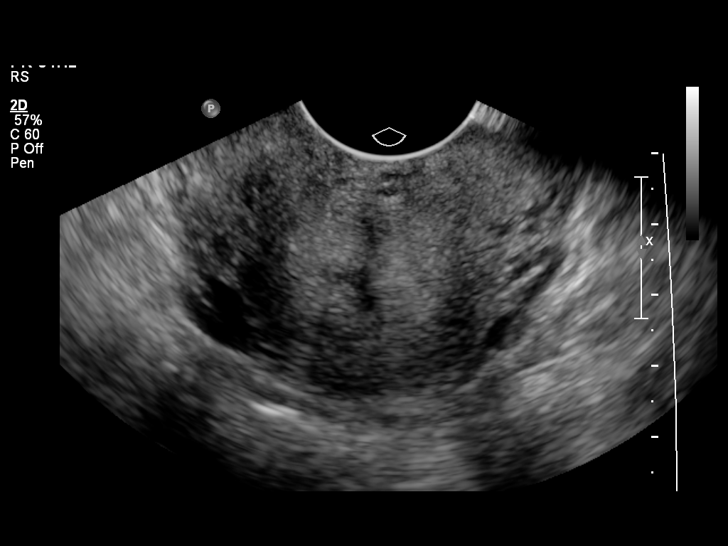

[14 of 25 positions shown; findings below may reference images not displayed]

It was necessary to proceed with endovaginal exam following the
transabdominal exam to visualize the uterus and endometrium.
FINDINGS: Uterus: 8.4 x 5.1 x 5.7 cm. Retroverted.

Endometrium: 1.6 cm in thickness.  The endometrium is heterogeneous
of unknown significance.  No obvious focal mass.

Right ovary:  3.6 x 2.4 x 2.6 cm.  2.4 cm simple cyst is present in
the right ovary.2.0 x 1.0 x 1.0 cm simple para ovarian cyst is
present adjacent to the right ovary.  It has a benign appearance.

Left ovary: 3.1 x 1.0 x 2.5 cm.  Within normal limits.

Other findings: No free fluid
IMPRESSION: Endometrium is somewhat thickened and heterogeneous.  Sono
hysterogram may be helpful.  This is of unknown significance.

Small benign appearing right para ovarian cyst.

## 2012-12-05 ENCOUNTER — Other Ambulatory Visit: Payer: Self-pay | Admitting: Obstetrics and Gynecology

## 2012-12-09 ENCOUNTER — Encounter (HOSPITAL_COMMUNITY): Payer: Self-pay | Admitting: *Deleted

## 2012-12-12 ENCOUNTER — Encounter (HOSPITAL_COMMUNITY)
Admission: RE | Disposition: A | Discharge status: Home / Self Care | Payer: Self-pay | Source: Ambulatory Visit | Attending: Obstetrics and Gynecology

## 2012-12-12 ENCOUNTER — Ambulatory Visit (HOSPITAL_COMMUNITY): Payer: Medicare Other | Admitting: Anesthesiology

## 2012-12-12 ENCOUNTER — Encounter (HOSPITAL_COMMUNITY): Payer: Self-pay | Admitting: Anesthesiology

## 2012-12-12 ENCOUNTER — Encounter (HOSPITAL_COMMUNITY): Payer: Self-pay | Admitting: Pharmacy Technician

## 2012-12-12 ENCOUNTER — Ambulatory Visit (HOSPITAL_COMMUNITY)
Admission: RE | Admit: 2012-12-12 | Discharge: 2012-12-13 | Disposition: A | Discharge status: Home / Self Care | Payer: Medicare Other | Source: Ambulatory Visit | Attending: Obstetrics and Gynecology | Admitting: Obstetrics and Gynecology

## 2012-12-12 DIAGNOSIS — N946 Dysmenorrhea, unspecified: Secondary | ICD-10-CM | POA: Insufficient documentation

## 2012-12-12 DIAGNOSIS — N920 Excessive and frequent menstruation with regular cycle: Secondary | ICD-10-CM | POA: Insufficient documentation

## 2012-12-12 DIAGNOSIS — Z23 Encounter for immunization: Secondary | ICD-10-CM | POA: Insufficient documentation

## 2012-12-12 DIAGNOSIS — D649 Anemia, unspecified: Secondary | ICD-10-CM | POA: Insufficient documentation

## 2012-12-12 HISTORY — PX: BILATERAL SALPINGECTOMY: SHX5743

## 2012-12-12 HISTORY — DX: Gastro-esophageal reflux disease without esophagitis: K21.9

## 2012-12-12 HISTORY — PX: ROBOTIC ASSISTED TOTAL HYSTERECTOMY: SHX6085

## 2012-12-12 LAB — BASIC METABOLIC PANEL
BUN: 8 mg/dL (ref 6–23)
CO2: 26 mEq/L (ref 19–32)
Chloride: 101 mEq/L (ref 96–112)
Creatinine, Ser: 0.83 mg/dL (ref 0.50–1.10)
GFR calc non Af Amer: 90 mL/min — ABNORMAL LOW (ref >90)
Glucose, Bld: 89 mg/dL (ref 70–99)
Sodium: 136 mEq/L (ref 135–145)

## 2012-12-12 LAB — CBC
HCT: 34.1 % — ABNORMAL LOW (ref 36.0–46.0)
Hemoglobin: 10.8 g/dL — ABNORMAL LOW (ref 12.0–15.0)
MCH: 26.7 pg (ref 26.0–34.0)
MCHC: 31.7 g/dL (ref 30.0–36.0)
RDW: 13.4 % (ref 11.5–15.5)

## 2012-12-12 LAB — SURGICAL PCR SCREEN: MRSA, PCR: NEGATIVE

## 2012-12-12 SURGERY — ROBOTIC ASSISTED TOTAL HYSTERECTOMY
Anesthesia: General | Site: Abdomen | Wound class: Clean Contaminated

## 2012-12-12 MED ORDER — SODIUM CHLORIDE 0.9 % IJ SOLN
INTRAMUSCULAR | Status: DC | PRN
  Administered 2012-12-12: 3 mL via INTRAVENOUS

## 2012-12-12 MED ORDER — FENTANYL CITRATE 0.05 MG/ML IJ SOLN
INTRAMUSCULAR | Status: DC | PRN
  Administered 2012-12-12: 100 ug via INTRAVENOUS
  Administered 2012-12-12: 150 ug via INTRAVENOUS
  Administered 2012-12-12: 100 ug via INTRAVENOUS
  Administered 2012-12-12: 50 ug via INTRAVENOUS
  Administered 2012-12-12: 100 ug via INTRAVENOUS

## 2012-12-12 MED ORDER — ONDANSETRON HCL 4 MG/2ML IJ SOLN
INTRAMUSCULAR | Status: DC | PRN
  Administered 2012-12-12: 4 mg via INTRAVENOUS

## 2012-12-12 MED ORDER — HYDROMORPHONE HCL PF 1 MG/ML IJ SOLN: 0.2500 mg | INTRAMUSCULAR | Status: DC | PRN

## 2012-12-12 MED ORDER — LEVETIRACETAM 500 MG PO TABS
1000.0000 mg | ORAL_TABLET | Freq: Two times a day (BID) | ORAL | Status: DC
  Administered 2012-12-12 – 2012-12-13 (×2): 1000 mg via ORAL
  Filled 2012-12-12 (×4): qty 2

## 2012-12-12 MED ORDER — PANTOPRAZOLE SODIUM 40 MG PO TBEC
40.0000 mg | DELAYED_RELEASE_TABLET | Freq: Every day | ORAL | Status: DC
  Administered 2012-12-12 – 2012-12-13 (×2): 40 mg via ORAL
  Filled 2012-12-12 (×3): qty 1

## 2012-12-12 MED ORDER — MIDAZOLAM HCL 2 MG/2ML IJ SOLN
INTRAMUSCULAR | Status: AC
  Filled 2012-12-12: qty 2

## 2012-12-12 MED ORDER — MENTHOL 3 MG MT LOZG: 1.0000 | LOZENGE | OROMUCOSAL | Status: DC | PRN

## 2012-12-12 MED ORDER — INFLUENZA VIRUS VACC SPLIT PF IM SUSP
0.5000 mL | INTRAMUSCULAR | Status: AC
  Administered 2012-12-13: 0.5 mL via INTRAMUSCULAR
  Filled 2012-12-12: qty 0.5

## 2012-12-12 MED ORDER — KETOROLAC TROMETHAMINE 30 MG/ML IJ SOLN
INTRAMUSCULAR | Status: DC | PRN
  Administered 2012-12-12: 30 mg via INTRAVENOUS

## 2012-12-12 MED ORDER — HYDROMORPHONE HCL PF 1 MG/ML IJ SOLN
INTRAMUSCULAR | Status: AC
  Filled 2012-12-12: qty 1

## 2012-12-12 MED ORDER — MIDAZOLAM HCL 5 MG/5ML IJ SOLN
INTRAMUSCULAR | Status: DC | PRN
  Administered 2012-12-12: 2 mg via INTRAVENOUS

## 2012-12-12 MED ORDER — CEFAZOLIN SODIUM-DEXTROSE 2-3 GM-% IV SOLR
2.0000 g | INTRAVENOUS | Status: AC
  Administered 2012-12-12: 2 g via INTRAVENOUS

## 2012-12-12 MED ORDER — PROPOFOL 10 MG/ML IV EMUL
INTRAVENOUS | Status: DC | PRN
  Administered 2012-12-12: 200 mg via INTRAVENOUS

## 2012-12-12 MED ORDER — MUPIROCIN 2 % EX OINT: TOPICAL_OINTMENT | Freq: Two times a day (BID) | CUTANEOUS | Status: DC

## 2012-12-12 MED ORDER — ONDANSETRON HCL 4 MG/2ML IJ SOLN
4.0000 mg | Freq: Four times a day (QID) | INTRAMUSCULAR | Status: DC | PRN
  Administered 2012-12-12 – 2012-12-13 (×3): 4 mg via INTRAVENOUS
  Filled 2012-12-12 (×3): qty 2

## 2012-12-12 MED ORDER — LIDOCAINE HCL (CARDIAC) 20 MG/ML IV SOLN
INTRAVENOUS | Status: AC
  Filled 2012-12-12: qty 5

## 2012-12-12 MED ORDER — LACTATED RINGERS IV SOLN
INTRAVENOUS | Status: DC
  Administered 2012-12-12: 12:00:00 via INTRAVENOUS

## 2012-12-12 MED ORDER — KETOROLAC TROMETHAMINE 30 MG/ML IJ SOLN: 15.0000 mg | Freq: Once | INTRAMUSCULAR | Status: DC | PRN

## 2012-12-12 MED ORDER — ONDANSETRON HCL 4 MG PO TABS: 4.0000 mg | ORAL_TABLET | Freq: Four times a day (QID) | ORAL | Status: DC | PRN

## 2012-12-12 MED ORDER — FENTANYL CITRATE 0.05 MG/ML IJ SOLN
INTRAMUSCULAR | Status: AC
  Filled 2012-12-12: qty 5

## 2012-12-12 MED ORDER — ONDANSETRON HCL 4 MG/2ML IJ SOLN
INTRAMUSCULAR | Status: AC
  Filled 2012-12-12: qty 2

## 2012-12-12 MED ORDER — BUPIVACAINE HCL (PF) 0.25 % IJ SOLN
INTRAMUSCULAR | Status: AC
  Filled 2012-12-12: qty 30

## 2012-12-12 MED ORDER — HYDROMORPHONE HCL PF 1 MG/ML IJ SOLN
INTRAMUSCULAR | Status: DC | PRN
  Administered 2012-12-12: 1 mg via INTRAVENOUS

## 2012-12-12 MED ORDER — GLYCOPYRROLATE 0.2 MG/ML IJ SOLN
INTRAMUSCULAR | Status: DC | PRN
  Administered 2012-12-12: 0.4 mg via INTRAVENOUS

## 2012-12-12 MED ORDER — OXCARBAZEPINE 300 MG PO TABS
450.0000 mg | ORAL_TABLET | Freq: Two times a day (BID) | ORAL | Status: DC
  Administered 2012-12-12 – 2012-12-13 (×2): 450 mg via ORAL
  Filled 2012-12-12 (×4): qty 1

## 2012-12-12 MED ORDER — PROMETHAZINE HCL 25 MG/ML IJ SOLN: 6.2500 mg | INTRAMUSCULAR | Status: DC | PRN

## 2012-12-12 MED ORDER — ROCURONIUM BROMIDE 100 MG/10ML IV SOLN
INTRAVENOUS | Status: DC | PRN
  Administered 2012-12-12 (×2): 20 mg via INTRAVENOUS
  Administered 2012-12-12: 50 mg via INTRAVENOUS

## 2012-12-12 MED ORDER — METRONIDAZOLE 500 MG PO TABS
500.0000 mg | ORAL_TABLET | Freq: Two times a day (BID) | ORAL | Status: DC
  Administered 2012-12-12 – 2012-12-13 (×2): 500 mg via ORAL
  Filled 2012-12-12 (×4): qty 1

## 2012-12-12 MED ORDER — LIDOCAINE HCL (CARDIAC) 20 MG/ML IV SOLN
INTRAVENOUS | Status: DC | PRN
  Administered 2012-12-12: 50 mg via INTRAVENOUS

## 2012-12-12 MED ORDER — KETOROLAC TROMETHAMINE 30 MG/ML IJ SOLN
INTRAMUSCULAR | Status: AC
  Filled 2012-12-12: qty 1

## 2012-12-12 MED ORDER — LACTATED RINGERS IR SOLN
Status: DC | PRN
  Administered 2012-12-12: 3000 mL

## 2012-12-12 MED ORDER — IBUPROFEN 800 MG PO TABS
800.0000 mg | ORAL_TABLET | Freq: Three times a day (TID) | ORAL | Status: DC | PRN
  Administered 2012-12-13: 800 mg via ORAL
  Filled 2012-12-12: qty 1

## 2012-12-12 MED ORDER — ZOLPIDEM TARTRATE 5 MG PO TABS: 5.0000 mg | ORAL_TABLET | Freq: Every evening | ORAL | Status: DC | PRN

## 2012-12-12 MED ORDER — DEXTROSE IN LACTATED RINGERS 5 % IV SOLN
INTRAVENOUS | Status: DC
  Administered 2012-12-12 – 2012-12-13 (×2): via INTRAVENOUS

## 2012-12-12 MED ORDER — DEXAMETHASONE SODIUM PHOSPHATE 10 MG/ML IJ SOLN
INTRAMUSCULAR | Status: AC
  Filled 2012-12-12: qty 1

## 2012-12-12 MED ORDER — BUPIVACAINE HCL (PF) 0.25 % IJ SOLN
INTRAMUSCULAR | Status: DC | PRN
  Administered 2012-12-12: 13 mL

## 2012-12-12 MED ORDER — OXYCODONE-ACETAMINOPHEN 5-325 MG PO TABS: 1.0000 | ORAL_TABLET | ORAL | Status: DC | PRN

## 2012-12-12 MED ORDER — DEXAMETHASONE SODIUM PHOSPHATE 4 MG/ML IJ SOLN
INTRAMUSCULAR | Status: DC | PRN
  Administered 2012-12-12: 10 mg via INTRAVENOUS

## 2012-12-12 MED ORDER — PROPOFOL 10 MG/ML IV EMUL
INTRAVENOUS | Status: AC
  Filled 2012-12-12: qty 20

## 2012-12-12 MED ORDER — NEOSTIGMINE METHYLSULFATE 1 MG/ML IJ SOLN
INTRAMUSCULAR | Status: DC | PRN
  Administered 2012-12-12: 3 mg via INTRAVENOUS

## 2012-12-12 MED ORDER — KETOROLAC TROMETHAMINE 30 MG/ML IJ SOLN
30.0000 mg | Freq: Four times a day (QID) | INTRAMUSCULAR | Status: DC
  Administered 2012-12-12 – 2012-12-13 (×3): 30 mg via INTRAVENOUS
  Filled 2012-12-12 (×3): qty 1

## 2012-12-12 MED ORDER — LUBIPROSTONE 24 MCG PO CAPS
24.0000 ug | ORAL_CAPSULE | Freq: Two times a day (BID) | ORAL | Status: DC
Start: 2012-12-13 — End: 2012-12-13
  Administered 2012-12-13: 24 ug via ORAL
  Filled 2012-12-12 (×3): qty 1

## 2012-12-12 MED ORDER — KETOROLAC TROMETHAMINE 30 MG/ML IJ SOLN: 30.0000 mg | Freq: Four times a day (QID) | INTRAMUSCULAR | Status: DC

## 2012-12-12 MED ORDER — MEPERIDINE HCL 25 MG/ML IJ SOLN: 6.2500 mg | INTRAMUSCULAR | Status: DC | PRN

## 2012-12-12 MED ORDER — ROCURONIUM BROMIDE 50 MG/5ML IV SOLN
INTRAVENOUS | Status: AC
  Filled 2012-12-12: qty 1

## 2012-12-12 MED ORDER — CEFAZOLIN SODIUM-DEXTROSE 2-3 GM-% IV SOLR
INTRAVENOUS | Status: AC
  Filled 2012-12-12: qty 50

## 2012-12-12 MED ORDER — ACETAMINOPHEN 10 MG/ML IV SOLN
INTRAVENOUS | Status: AC
  Filled 2012-12-12: qty 100

## 2012-12-12 MED ORDER — HYDROMORPHONE HCL PF 1 MG/ML IJ SOLN: 0.2000 mg | INTRAMUSCULAR | Status: DC | PRN

## 2012-12-12 MED ORDER — MUPIROCIN 2 % EX OINT
TOPICAL_OINTMENT | CUTANEOUS | Status: AC
  Administered 2012-12-12: 1 via NASAL
  Filled 2012-12-12: qty 22

## 2012-12-12 MED ORDER — ACETAMINOPHEN 10 MG/ML IV SOLN
1000.0000 mg | Freq: Four times a day (QID) | INTRAVENOUS | Status: DC
  Administered 2012-12-12: 1000 mg via INTRAVENOUS

## 2012-12-12 SURGICAL SUPPLY — 63 items
ADH SKN CLS APL DERMABOND .7 (GAUZE/BANDAGES/DRESSINGS) ×2
BAG URINE DRAINAGE (UROLOGICAL SUPPLIES) ×3 IMPLANT
BARRIER ADHS 3X4 INTERCEED (GAUZE/BANDAGES/DRESSINGS) ×3 IMPLANT
BRR ADH 4X3 ABS CNTRL BYND (GAUZE/BANDAGES/DRESSINGS) ×2
CATH FOLEY 3WAY  5CC 16FR (CATHETERS) ×1
CATH FOLEY 3WAY 5CC 16FR (CATHETERS) ×2 IMPLANT
CHLORAPREP W/TINT 26ML (MISCELLANEOUS) ×3 IMPLANT
CLOTH BEACON ORANGE TIMEOUT ST (SAFETY) ×3 IMPLANT
CONT PATH 16OZ SNAP LID 3702 (MISCELLANEOUS) ×3 IMPLANT
COVER MAYO STAND STRL (DRAPES) ×3 IMPLANT
COVER TABLE BACK 60X90 (DRAPES) ×6 IMPLANT
COVER TIP SHEARS 8 DVNC (MISCELLANEOUS) ×2 IMPLANT
COVER TIP SHEARS 8MM DA VINCI (MISCELLANEOUS) ×1
DECANTER SPIKE VIAL GLASS SM (MISCELLANEOUS) ×3 IMPLANT
DERMABOND ADVANCED (GAUZE/BANDAGES/DRESSINGS) ×1
DERMABOND ADVANCED .7 DNX12 (GAUZE/BANDAGES/DRESSINGS) ×2 IMPLANT
DEVICE TROCAR PUNCTURE CLOSURE (ENDOMECHANICALS) IMPLANT
DRAPE HUG U DISPOSABLE (DRAPE) ×3 IMPLANT
DRAPE LG THREE QUARTER DISP (DRAPES) ×6 IMPLANT
DRAPE WARM FLUID 44X44 (DRAPE) ×3 IMPLANT
ELECT REM PT RETURN 9FT ADLT (ELECTROSURGICAL) ×3
ELECTRODE REM PT RTRN 9FT ADLT (ELECTROSURGICAL) ×2 IMPLANT
EVACUATOR SMOKE 8.L (FILTER) ×3 IMPLANT
GAUZE VASELINE 3X9 (GAUZE/BANDAGES/DRESSINGS) IMPLANT
GLOVE BIO SURGEON STRL SZ 6.5 (GLOVE) ×3 IMPLANT
GLOVE BIOGEL PI IND STRL 7.0 (GLOVE) ×4 IMPLANT
GLOVE BIOGEL PI INDICATOR 7.0 (GLOVE) ×2
GOWN STRL REIN XL XLG (GOWN DISPOSABLE) ×18 IMPLANT
KIT ACCESSORY DA VINCI DISP (KITS) ×1
KIT ACCESSORY DVNC DISP (KITS) ×2 IMPLANT
LEGGING LITHOTOMY PAIR STRL (DRAPES) ×3 IMPLANT
NEEDLE INSUFFLATION 120MM (ENDOMECHANICALS) ×3 IMPLANT
OCCLUDER COLPOPNEUMO (BALLOONS) IMPLANT
PACK LAVH (CUSTOM PROCEDURE TRAY) ×3 IMPLANT
PAD PREP 24X48 CUFFED NSTRL (MISCELLANEOUS) ×6 IMPLANT
PLUG CATH AND CAP STER (CATHETERS) ×3 IMPLANT
PROTECTOR NERVE ULNAR (MISCELLANEOUS) ×6 IMPLANT
SCISSORS LAP 5X35 DISP (ENDOMECHANICALS) IMPLANT
SET CYSTO W/LG BORE CLAMP LF (SET/KITS/TRAYS/PACK) IMPLANT
SET IRRIG TUBING LAPAROSCOPIC (IRRIGATION / IRRIGATOR) ×3 IMPLANT
SOLUTION ELECTROLUBE (MISCELLANEOUS) ×3 IMPLANT
SUT VIC AB 0 CT1 27 (SUTURE) ×18
SUT VIC AB 0 CT1 27XBRD ANTBC (SUTURE) ×10 IMPLANT
SUT VICRYL 0 UR6 27IN ABS (SUTURE) ×3 IMPLANT
SUT VICRYL 4-0 PS2 18IN ABS (SUTURE) ×6 IMPLANT
SYR 50ML LL SCALE MARK (SYRINGE) ×3 IMPLANT
SYRINGE 10CC LL (SYRINGE) ×3 IMPLANT
SYSTEM CONVERTIBLE TROCAR (TROCAR) IMPLANT
TIP RUMI ORANGE 6.7MMX12CM (TIP) IMPLANT
TIP UTERINE 5.1X6CM LAV DISP (MISCELLANEOUS) IMPLANT
TIP UTERINE 6.7X10CM GRN DISP (MISCELLANEOUS) IMPLANT
TIP UTERINE 6.7X6CM WHT DISP (MISCELLANEOUS) IMPLANT
TIP UTERINE 6.7X8CM BLUE DISP (MISCELLANEOUS) IMPLANT
TOWEL OR 17X24 6PK STRL BLUE (TOWEL DISPOSABLE) ×9 IMPLANT
TROCAR 12M 150ML BLUNT (TROCAR) ×1 IMPLANT
TROCAR DILATING TIP 12MM 150MM (ENDOMECHANICALS) ×3 IMPLANT
TROCAR DISP BLADELESS 8 DVNC (TROCAR) IMPLANT
TROCAR DISP BLADELESS 8MM (TROCAR)
TROCAR XCEL 12X100 BLDLESS (ENDOMECHANICALS) ×3 IMPLANT
TROCAR XCEL NON-BLD 5MMX100MML (ENDOMECHANICALS) ×3 IMPLANT
TUBING FILTER THERMOFLATOR (ELECTROSURGICAL) ×3 IMPLANT
WARMER LAPAROSCOPE (MISCELLANEOUS) ×3 IMPLANT
WATER STERILE IRR 1000ML POUR (IV SOLUTION) ×9 IMPLANT

## 2012-12-12 NOTE — Brief Op Note (Signed)
12/12/2012  4:40 PM  PATIENT:  Leslie Zhang  37 y.o. female  PRE-OPERATIVE DIAGNOSIS:  Menorrhagia, Dysmenorrhea  [phone]  POST-OPERATIVE DIAGNOSIS:  Menorrhagia, Dysmenorrhea   PROCEDURE:  DaVinci robotic total hysterectomy, bilateral salpingectomy  'SURGEON:  Surgeon(s) and Role:    * Kaytee Taliercio Cathie Beams, MD - Primary  PHYSICIAN ASSISTANT:   ASSISTANTS: Noland Fordyce, MD   ANESTHESIA:   general Findings: nl ovaries, surgicaly separated tubes, bulky uterus no evidence of endometriosis EBL:  Total I/O In: 1600 [I.V.:1600] Out: 275 [Urine:125; Blood:150]  BLOOD ADMINISTERED:none  DRAINS: none   LOCAL MEDICATIONS USED:  MARCAINE     SPECIMEN:  Source of Specimen:  uterus with cervix, both tubes  DISPOSITION OF SPECIMEN:  PATHOLOGY  COUNTS:  YES  TOURNIQUET:  * No tourniquets in log *  DICTATION: .Other Dictation: Dictation Number [id number]  PLAN OF CARE: Admit for overnight observation  PATIENT DISPOSITION:  PACU - hemodynamically stable.   Delay start of Pharmacological VTE agent (>24hrs) due to surgical blood loss or risk of bleeding: no

## 2012-12-12 NOTE — Anesthesia Postprocedure Evaluation (Signed)
Anesthesia Post Note  Patient: Leslie Zhang  Procedure(s) Performed: Procedure(s) (LRB): ROBOTIC ASSISTED TOTAL HYSTERECTOMY (N/A) BILATERAL SALPINGECTOMY (Bilateral)  Anesthesia type: GA  Patient location: PACU  Post pain: Pain level controlled  Post assessment: Post-op Vital signs reviewed  Last Vitals:  Filed Vitals:   12/12/12 1700  BP: 140/74  Pulse: 81  Temp:   Resp: 17    Post vital signs: Reviewed  Level of consciousness: sedated  Complications: No apparent anesthesia complications

## 2012-12-12 NOTE — Anesthesia Preprocedure Evaluation (Signed)
Anesthesia Evaluation  Patient identified by MRN, date of birth, ID band Patient awake    Reviewed: Allergy & Precautions, H&P , NPO status , Patient's Chart, lab work & pertinent test results  Airway Mallampati: I TM Distance: >3 FB Neck ROM: full    Dental no notable dental hx. (+) Teeth Intact   Pulmonary neg pulmonary ROS,          Cardiovascular     Neuro/Psych Seizures -, Well Controlled,  negative psych ROS   GI/Hepatic Neg liver ROS, Resolved with avoidance of Lactose   Endo/Other  negative endocrine ROS  Renal/GU negative Renal ROS  negative genitourinary   Musculoskeletal negative musculoskeletal ROS (+)   Abdominal Normal abdominal exam  (+)   Peds negative pediatric ROS (+)  Hematology negative hematology ROS (+)   Anesthesia Other Findings   Reproductive/Obstetrics negative OB ROS                           Anesthesia Physical Anesthesia Plan  ASA: II  Anesthesia Plan: General   Post-op Pain Management:    Induction: Intravenous  Airway Management Planned: Oral ETT  Additional Equipment:   Intra-op Plan:   Post-operative Plan: Extubation in OR  Informed Consent: I have reviewed the patients History and Physical, chart, labs and discussed the procedure including the risks, benefits and alternatives for the proposed anesthesia with the patient or authorized representative who has indicated his/her understanding and acceptance.   Dental Advisory Given and History available from chart only  Plan Discussed with: CRNA and Surgeon  Anesthesia Plan Comments:         Anesthesia Quick Evaluation

## 2012-12-12 NOTE — Transfer of Care (Signed)
Immediate Anesthesia Transfer of Care Note  Patient: Leslie Zhang  Procedure(s) Performed: Procedure(s) with comments: ROBOTIC ASSISTED TOTAL HYSTERECTOMY (N/A) - 3 hrs. BILATERAL SALPINGECTOMY (Bilateral)  Patient Location: PACU  Anesthesia Type:General  Level of Consciousness: awake, alert  and oriented  Airway & Oxygen Therapy: Patient Spontanous Breathing and Patient connected to nasal cannula oxygen  Post-op Assessment: Report given to PACU RN and Post -op Vital signs reviewed and stable  Post vital signs: Reviewed and stable  Complications: No apparent anesthesia complications

## 2012-12-13 ENCOUNTER — Encounter (HOSPITAL_COMMUNITY): Payer: Self-pay | Admitting: Obstetrics and Gynecology

## 2012-12-13 LAB — CBC
MCH: 26.8 pg (ref 26.0–34.0)
MCV: 84.3 fL (ref 78.0–100.0)
Platelets: 377 10*3/uL (ref 150–400)
RDW: 13.3 % (ref 11.5–15.5)

## 2012-12-13 LAB — BASIC METABOLIC PANEL
CO2: 26 mEq/L (ref 19–32)
Calcium: 8.9 mg/dL (ref 8.4–10.5)
Creatinine, Ser: 0.77 mg/dL (ref 0.50–1.10)
Glucose, Bld: 134 mg/dL — ABNORMAL HIGH (ref 70–99)

## 2012-12-13 MED ORDER — OXYCODONE-ACETAMINOPHEN 5-325 MG PO TABS: 1.0000 | ORAL_TABLET | ORAL | Status: DC | PRN

## 2012-12-13 NOTE — Discharge Summary (Signed)
Physician Discharge Summary  Patient ID: Leslie Zhang MRN: [id number] DOB/AGE: 1976-07-16 37 y.o.  Admit date: 12/12/2012 Discharge date: 12/13/2012  Admission Diagnoses: menorrhagia, dysmenorrhea  Discharge Diagnoses: menorrhagia, dysmenorrhea Active Problems:   * No active hospital problems. *   Discharged Condition: stable  Hospital Course: admitted to [facility] for daVinci robotic TLH, bilateral salpingectomy. Unremarkable postop course  Consults: None  Significant Diagnostic Studies: labs: hgb 9.1  hct 31 creatinine 0.77, plt 134K  Treatments: surgery: DaVinci robotic total hysterectomy, bilateral salpingectomy  Discharge Exam: Blood pressure 107/50, pulse 73, temperature 98.5 F (36.9 C), temperature source Oral, resp. rate 18, height 5\' 7"  (1.702 m), weight 77.565 kg (171 lb), SpO2 100.00%. General appearance: alert, cooperative and no distress Resp: clear to auscultation bilaterally GI: soft, non-tender; bowel sounds normal; no masses,  no organomegaly and incisions well approximated Pelvic: deferred Extremities: no edema, redness or tenderness in the calves or thighs  Disposition: 01-Home or Self Care  Discharge Orders   Future Orders Complete By Expires     Diet general  As directed     Discharge instructions  As directed     Comments:      Call if temperature greater than equal to 100.4, nothing per vagina for 4-6 weeks or severe nausea vomiting, increased incisional pain , drainage or redness in the incision site, no straining with bowel movements, showers no bath    Discharge patient  As directed     May walk up steps  As directed         Medication List    TAKE these medications       clobetasol cream 0.05 %  Commonly known as:  TEMOVATE  Apply 1 application topically 2 (two) times daily as needed. For rash     HAIR/SKIN/NAILS PO  Take 1 tablet by mouth daily.     ibuprofen 800 MG tablet  Commonly known as:  ADVIL,MOTRIN  Take 800 mg by mouth  every 8 (eight) hours as needed. For headache or pain     levETIRAcetam 500 MG tablet  Commonly known as:  KEPPRA  Take 1,000 mg by mouth 2 (two) times daily.     lubiprostone 24 MCG capsule  Commonly known as:  AMITIZA  Take 24 mcg by mouth 2 (two) times daily with a meal.     metroNIDAZOLE 500 MG tablet  Commonly known as:  FLAGYL  Take 500 mg by mouth 2 (two) times daily. X 7 days, not yet completed     Oxcarbazepine 300 MG tablet  Commonly known as:  TRILEPTAL  Take 450 mg by mouth 2 (two) times daily. Take 1 and 1/2 tabs twice daily     oxyCODONE-acetaminophen 5-325 MG per tablet  Commonly known as:  PERCOCET/ROXICET  Take 1-2 tablets by mouth every 4 (four) hours as needed.           Follow-up Information   Follow up with Lue Sykora A, MD In 2 weeks.   Contact information:   [address] [place] [address] [phone]       Signed: Reshonda Koerber A 12/13/2012, 1:54 PM

## 2012-12-13 NOTE — Progress Notes (Signed)
Subjective: Patient reports tolerating PO and no problems voiding.    Objective: I have reviewed patient's vital signs.  vital signs, intake and output and labs. Filed Vitals:   12/13/12 1000  BP: 107/50  Pulse: 73  Temp: 98.5 F (36.9 C)  Resp: 18   I/O last 3 completed shifts: In: 3745.7 [P.O.:360; I.V.:3385.7] Out: 1675 [Urine:1325; Emesis/NG output:200; Blood:150] Total I/O In: 879.2 [P.O.:200; I.V.:679.2] Out: 850 [Urine:850]  Lab Results  Component Value Date   WBC 12.2* 12/13/2012   HGB 9.9* 12/13/2012   HCT 31.1* 12/13/2012   MCV 84.3 12/13/2012   PLT 377 12/13/2012   Lab Results  Component Value Date   CREATININE 0.77 12/13/2012    EXAM General: alert, cooperative and no distress Resp: clear to auscultation bilaterally Cardio: regular rate and rhythm, S1, S2 normal, no murmur, click, rub or gallop GI: soft, non-tender; bowel sounds normal; no masses,  no organomegaly and incision: clean, dry and intact Extremities: no edema, redness or tenderness in the calves or thighs Vaginal Bleeding: none  Assessment: s/p Procedure(s):\DAvinci ROBOTIC ASSISTED TOTAL HYSTERECTOMY BILATERAL SALPINGECTOMY: stable, progressing well, tolerating diet and anemia  Plan: Advance diet Advance to PO medication Discontinue IV fluids Discharge home  LOS: 1 day   D/c instructions reviewed. F/u 2 weeks.  Mira Balon A, MD 12/13/2012 1:50 PM    12/13/2012, 1:50 PM

## 2012-12-13 NOTE — Op Note (Signed)
NAMESHAMIR, SEDLAR NO.:  [id number]  MEDICAL RECORD NO.:  [id number]  LOCATION:  9302                          FACILITY:  [facility]  PHYSICIAN:  Maxie Better, M.D.DATE OF BIRTH:  02-Dec-1975  DATE OF PROCEDURE:  12/12/2012 DATE OF DISCHARGE:                              OPERATIVE REPORT   PREOPERATIVE DIAGNOSIS: 1. Menorrhagia. 2. Dysmenorrhea.  PROCEDURE:  daVinci robotic total hysterectomy and bilateral salpingectomy.  POSTOPERATIVE DIAGNOSIS: 1. Menorrhagia. 2. Dysmenorrhea.  ANESTHESIA:  General.  SURGEON:  Maxie Better, MD.  ASSISTANT:  Lendon Colonel, MD  PROCEDURE:  Under adequate general anesthesia, the patient was placed in dorsal lithotomy position.  She was positioned for robotic surgery and positioned towards the right side.  The examination under anesthesia revealed a retroverted bulky uterus.  No adnexal masses could be appreciated.  The patient was sterilely ChloraPrep and Betadine prep. The patient was then sterilely draped.  Indwelling Foley catheter was sterilely placed.  A weighted speculum was placed in vagina.   0 Vicryl figure-of-eight suture was placed on the anterior and posterior lip of the cervix.  The uterus sounded to 10 cm.  A #10 uterine manipulator was introduced into the uterine cavity having been placed over the small RUMI KOH ring and ring placed securely around the cervicovaginal junction, and intrauterine balloon insufflated without incident.  The weighted speculum was removed from the vagina.  Attention was then turned to the abdomen, 0.25% Marcaine was injected.  Supraumbilical incision was then made.  A small vertical incision was made.  Veress needle was introduced, tested with normal saline, replaced by a 12 mm disposable trocar, which was introduced in the abdomen without incident.  A robotic camera was then placed through that port.  The patient was then placed in deep Trendelenburg position.  The  normal liver edge was noted.  The pelvis was noted which showed prominent vessels.  Uterus was otherwise slightly enlarged, but not abnormal in appearance.  The 2 additional robotic port site was then placed about 10 cm apart from the midline, 2 on the left, 1 on the right, 8 mm robotic ports, and a 5-mm assistant port was then placed all under direct visualization.  The robot was then docked on the patient's left side.  Once this was done, the arm #1 had monopolar scissors, on arm #2 ,PK dissector, and arm # 3 the Pro Grasp were placed.  I then went to the surgical console.  The pelvis was further inspected.  Both ureters were noted to be the peristalsing deep in the pelvis.  The tubes had evidence of prior surgical separation.  Starting on the patient's right side, the distal portion of fallopian tube was removed after serial clamping of the mesosalpinx, cauterizing then cutting it.  The round ligaments were then cauterized and subsequently cut.  The proximal portion of the fallopian tube was then underlined and mesosalpinx was then serially clamped, cauterized, and then cut.  The utero-ovarian ligament was then clamped, cauterized, and cut.  The anterior vesicouterine peritoneum was then opened transversely.  The uterine vessels were skeletonized.  The Bladder peritoneum which had prominent vessels was carefully opened, and with sharp dissection was displaced Inferiorly.  The RUMI core ring was then used to identify the upper portion of the vagina.  Uterine vessels were clamped, cauterized, and then cut. Attention was then turned to the opposite side where similar procedure was performed with the round ligaments being clamped, cauterized, and cut.  The bladder peritoneum was then further opened transversely entirely and the bladder was sharply dissected and displaced inferiorly. The left fallopian tube, then distal portion was then grasped and lysed. The mesosalpinx was then serially clamped,  cauterized, and cut and removed.  The proximal portion of the tube with an underlying mesosalpinx  was clamped, cauterized, and cut.  The utero-ovarian ligaments were then clamped, cauterized, and then cut, followed by the posterior leaf of the broad ligament opened inferiorly and the anterior leaf of the broad ligament also continued to be opened up.  The uterine vessels were skeletonized.  They were tortuous, but they were clamped, cauterized, and then cut.  The uterus was noted to be blanching well. The bladder dissection was continued distally and the upper portion the core ring was then opened transversely and circumferentially at the cervicovaginal junction.  This was carried around until the uterus was detached from the vagina.  The vaginal occluder was removed.  The uterus with the 2 pieces of the distal fallopian tube was brought through the vagina.  The bleeders on the vaginal cuff were then carefully cauterized.  The monopolar scissors and the PK dissectors were then replaced with a  long tip forceps and the large suture cut needle driver.  0 Vicryl sutures were then placed through the umbilical site and the vaginal cuff was then closed with 0 Vicryl figure-of-eight sutures. Once this was done, the pelvis was carefully inspected and irrigated.  The appendix was noted to be normal.  The robotic instruments were removed.  The robot was undocked.  Using an 8 mm  robotic camera, the needles from the vaginal cuff suturing was then removed. The pelvis was once again inspected, irrigated and suctioned. Good hemostasis was noted.  The port sites were then removed. All instruments were removed, taking care not to bring up any underlying structure in these  Exira sites.  Once this was done, the incisions were closed with 4-0 Vicryl subcuticular stitch.  The fascial closure done at the  supraumbilical site with 0 Vicryl and the skin approximated with 4-0 Vicryl subcuticular stitch.  The vaginal cuff  was once again palpated and good approximation noted.  The procedure was felt to be complete. Specimen labelled  uterus, cervix, and fallopian tubes sent to Pathology.  ESTIMATED BLOOD LOSS:  150 mL.  INTRAOPERATIVE FLUID:  2 L.  URINE OUTPUT:  100 mL.  Sponge and instrument counts x2 was correct.  COMPLICATION:  None.  The patient tolerated the procedure well, was transferred to recovery in stable condition.     Maxie Better, M.D.     [place]/MEDQ  D:  12/12/2012  T:  12/13/2012  Job:  [id number]

## 2012-12-13 NOTE — Anesthesia Postprocedure Evaluation (Signed)
Anesthesia Post Note  Patient: Leslie Zhang  Procedure(s) Performed: Procedure(s) with comments: ROBOTIC ASSISTED TOTAL HYSTERECTOMY (N/A) - 3 hrs. BILATERAL SALPINGECTOMY (Bilateral)  Anesthesia type: General  Patient location: [facility]  Post pain: Pain level controlled  Post assessment: Post-op Vital signs reviewed  Last Vitals: BP 121/80  Pulse 100  Temp(Src) 36.9 C (Oral)  Resp 18  Ht 5\' 7"  (1.702 m)  Wt 171 lb (77.565 kg)  BMI 26.78 kg/m2  SpO2 100%  Post vital signs: Reviewed  Level of consciousness: awake  Complications: No apparent anesthesia complications

## 2014-02-14 ENCOUNTER — Encounter (HOSPITAL_COMMUNITY): Payer: Self-pay | Admitting: Emergency Medicine

## 2014-02-14 ENCOUNTER — Emergency Department (HOSPITAL_COMMUNITY)
Admission: EM | Admit: 2014-02-14 | Discharge: 2014-02-14 | Disposition: A | Discharge status: Home / Self Care | Payer: Medicare Other | Attending: Emergency Medicine | Admitting: Emergency Medicine

## 2014-02-14 ENCOUNTER — Emergency Department (HOSPITAL_COMMUNITY): Payer: Medicare Other

## 2014-02-14 DIAGNOSIS — R5383 Other fatigue: Secondary | ICD-10-CM

## 2014-02-14 DIAGNOSIS — Z8719 Personal history of other diseases of the digestive system: Secondary | ICD-10-CM | POA: Insufficient documentation

## 2014-02-14 DIAGNOSIS — M79609 Pain in unspecified limb: Secondary | ICD-10-CM | POA: Insufficient documentation

## 2014-02-14 DIAGNOSIS — L659 Nonscarring hair loss, unspecified: Secondary | ICD-10-CM | POA: Insufficient documentation

## 2014-02-14 DIAGNOSIS — R002 Palpitations: Secondary | ICD-10-CM | POA: Insufficient documentation

## 2014-02-14 DIAGNOSIS — M79603 Pain in arm, unspecified: Secondary | ICD-10-CM

## 2014-02-14 DIAGNOSIS — R5381 Other malaise: Secondary | ICD-10-CM | POA: Insufficient documentation

## 2014-02-14 DIAGNOSIS — G40909 Epilepsy, unspecified, not intractable, without status epilepticus: Secondary | ICD-10-CM | POA: Insufficient documentation

## 2014-02-14 LAB — CBC WITH DIFFERENTIAL/PLATELET
BASOS PCT: 1 % (ref 0–1)
Basophils Absolute: 0.1 10*3/uL (ref 0.0–0.1)
Eosinophils Absolute: 0.5 10*3/uL (ref 0.0–0.7)
Eosinophils Relative: 7 % — ABNORMAL HIGH (ref 0–5)
HEMATOCRIT: 38 % (ref 36.0–46.0)
HEMOGLOBIN: 13 g/dL (ref 12.0–15.0)
LYMPHS ABS: 2.8 10*3/uL (ref 0.7–4.0)
LYMPHS PCT: 35 % (ref 12–46)
MCH: 30.4 pg (ref 26.0–34.0)
MCHC: 34.2 g/dL (ref 30.0–36.0)
MCV: 88.8 fL (ref 78.0–100.0)
MONOS PCT: 10 % (ref 3–12)
Monocytes Absolute: 0.8 10*3/uL (ref 0.1–1.0)
NEUTROS ABS: 3.8 10*3/uL (ref 1.7–7.7)
NEUTROS PCT: 48 % (ref 43–77)
Platelets: 396 10*3/uL (ref 150–400)
RBC: 4.28 MIL/uL (ref 3.87–5.11)
RDW: 12.1 % (ref 11.5–15.5)
WBC: 8 10*3/uL (ref 4.0–10.5)

## 2014-02-14 LAB — BASIC METABOLIC PANEL
BUN: 13 mg/dL (ref 6–23)
CHLORIDE: 99 meq/L (ref 96–112)
CO2: 26 meq/L (ref 19–32)
Calcium: 10 mg/dL (ref 8.4–10.5)
Creatinine, Ser: 0.75 mg/dL (ref 0.50–1.10)
GFR calc non Af Amer: >90 mL/min (ref >90)
Glucose, Bld: 83 mg/dL (ref 70–99)
POTASSIUM: 4.2 meq/L (ref 3.7–5.3)
Sodium: 137 mEq/L (ref 137–147)

## 2014-02-14 LAB — I-STAT TROPONIN, ED
TROPONIN I, POC: 0 ng/mL (ref 0.00–0.08)
Troponin i, poc: 0.02 ng/mL (ref 0.00–0.08)

## 2014-02-14 MED ORDER — CYCLOBENZAPRINE HCL 7.5 MG PO TABS: 7.5000 mg | ORAL_TABLET | Freq: Three times a day (TID) | ORAL | Status: AC | PRN

## 2014-02-14 MED ORDER — ASPIRIN 81 MG PO CHEW
324.0000 mg | CHEWABLE_TABLET | Freq: Once | ORAL | Status: AC
  Administered 2014-02-14: 324 mg via ORAL
  Filled 2014-02-14: qty 4

## 2014-02-14 MED ORDER — NAPROXEN 500 MG PO TABS: 500.0000 mg | ORAL_TABLET | Freq: Two times a day (BID) | ORAL | Status: AC

## 2014-02-14 MED ORDER — SODIUM CHLORIDE 0.9 % IV SOLN
INTRAVENOUS | Status: DC
Start: 2014-02-14 — End: 2014-02-15
  Administered 2014-02-14: 20:00:00 via INTRAVENOUS

## 2014-02-14 NOTE — ED Notes (Addendum)
Pt c/o fatigue x 2 days and intermittent "heart racing" and L arm pain/weakness since 1700.  Pain score 8/10.  Denies cardiac history.

## 2014-02-14 NOTE — ED Provider Notes (Signed)
CSN: [id number]     Arrival date & time 02/14/14  1845 History   First MD Initiated Contact with Patient 02/14/14 1850     Chief Complaint  Patient presents with  . Fatigue  . Tachycardia  . Arm Pain    HPI Pt has been feeling fatigues for the last several days.  She has been feeling cold and does not feel like she has a lot of energy.  Today she felt her heart was racing.  She also was having pain in her left arm.  The heart racing started first.  Those symptoms would come and go.    No fevers, no cough.  No leg swelling.  No history of heart or lung problems.  No history of blood clots.     She has had trouble with pain in her left arm in the past and was told it was muscle spasm.  Pt has been having hair loss recently.  She was evaluated by her doctor and has had thyroid testing. Past Medical History  Diagnosis Date  . Seizures     last sz 1 yr ago  . PONV (postoperative nausea and vomiting)   . GERD (gastroesophageal reflux disease)    Past Surgical History  Procedure Laterality Date  . Tubal ligation  2000  . Brain surgery  2008    left temporal lobe for seizures.   . Robotic assisted total hysterectomy N/A 12/12/2012    Procedure: ROBOTIC ASSISTED TOTAL HYSTERECTOMY;  Surgeon: Serita KyleSheronette A Cousins, MD;  Location: [facility];  Service: Gynecology;  Laterality: N/A;  3 hrs.  . Bilateral salpingectomy Bilateral 12/12/2012    Procedure: BILATERAL SALPINGECTOMY;  Surgeon: Serita KyleSheronette A Cousins, MD;  Location: [facility];  Service: Gynecology;  Laterality: Bilateral;   Family History  Problem Relation Age of Onset  . Asthma Mother   . Hypertension Mother   . Asthma Brother   . Hypertension Brother   . Asthma Maternal Grandmother   . Diabetes Maternal Grandmother   . Hypertension Maternal Grandmother   . Diabetes Maternal Grandfather    History  Substance Use Topics  . Smoking status: Never Smoker   . Smokeless tobacco: Not on file  . Alcohol Use: No   OB History   Grav Para  Term Preterm Abortions TAB SAB Ect Mult Living   2 2 2  0  0 0 0 0 2     Review of Systems  Constitutional: Negative for fever.  Gastrointestinal: Negative for abdominal pain.  Genitourinary: Negative for dysuria.  Neurological: Negative for syncope.  All other systems reviewed and are negative.     Allergies  Contrast media and Lamictal  Home Medications   Prior to Admission medications   Medication Start Date End Date Taking? Authorizing Provider  clobetasol (TEMOVATE) 0.05 % cream    Historical Provider, MD  ibuprofen (ADVIL,MOTRIN) 800 MG tablet    Historical Provider, MD  levETIRAcetam (KEPPRA) 500 MG tablet    Historical Provider, MD  lubiprostone (AMITIZA) 24 MCG capsule    Historical Provider, MD  Multiple Vitamins-Minerals (HAIR/SKIN/NAILS PO)    Historical Provider, MD  Oxcarbazepine (TRILEPTAL) 300 MG tablet    Historical Provider, MD   BP 149/85  Pulse 79  Temp(Src) 98.4 F (36.9 C) (Oral)  Resp 18  Ht 5\' 6"  (1.676 m)  Wt 178 lb (80.74 kg)  BMI 28.74 kg/m2  SpO2 100%  LMP 07/29/2011 Physical Exam  Nursing note and vitals reviewed. Constitutional: She appears well-developed and well-nourished. No  distress.  HENT:  Head: Normocephalic and atraumatic.  Right Ear: External ear normal.  Left Ear: External ear normal.  Eyes: Conjunctivae are normal. Right eye exhibits no discharge. Left eye exhibits no discharge. No scleral icterus.  Neck: Neck supple. No tracheal deviation present. No thyromegaly present.  Cardiovascular: Normal rate, regular rhythm and intact distal pulses.   Pulmonary/Chest: Effort normal and breath sounds normal. No stridor. No respiratory distress. She has no wheezes. She has no rales.  Abdominal: Soft. Bowel sounds are normal. She exhibits no distension. There is no tenderness. There is no rebound and no guarding.  Musculoskeletal: She exhibits tenderness. She exhibits no edema.       Arms: Neurological: She is alert. She has normal  strength. No cranial nerve deficit (no facial droop, extraocular movements intact, no slurred speech) or sensory deficit. She exhibits normal muscle tone. She displays no seizure activity. Coordination normal.  Skin: Skin is warm and dry. No rash noted.  Psychiatric: She has a normal mood and affect.    ED Course  Procedures (including critical care time) Labs Review Labs Reviewed  CBC WITH DIFFERENTIAL - Abnormal; Notable for the following:    Eosinophils Relative 7 (*)    All other components within normal limits  BASIC METABOLIC PANEL  I-STAT TROPOININ, ED  Rosezena SensorI-STAT TROPOININ, ED    Imaging Review Dg Chest 2 View  02/14/2014   CLINICAL DATA:  Tachycardia and fatigue.  EXAM: CHEST  2 VIEW  COMPARISON:  None.  FINDINGS: The cardiac silhouette, mediastinal and hilar contours are normal. The lungs are clear. No pleural effusion. The bony thorax is intact.  IMPRESSION: No acute cardiopulmonary findings.   Electronically Signed   By: Loralie ChampagneMark  Gallerani M.D.   On: 02/14/2014 20:08    EKG Normal sinus rhythm rate 81 Normal axis normal intervals Normal ST-T wave  MDM   Final diagnoses:  Palpitations  Arm pain    Doubt cardiac etiology.  Symptoms are atypical.  Palpable ttp in [place].  EKG reassuring.  Cardiac enzymes times two normal.  Will dc home with NSAIDS.   Follow up with PCP    Celene KrasJon R Ivie Savitt, MD 02/14/14 [phone]

## 2014-02-14 NOTE — Discharge Instructions (Signed)
Palpitations  A palpitation is the feeling that your heartbeat is irregular. It may feel like your heart is fluttering or skipping a beat. It may also feel like your heart is beating faster than normal. This is usually not a serious problem. In some cases, you may need more medical tests. HOME CARE  Avoid:  Caffeine in coffee, tea, soft drinks, diet pills, and energy drinks.  Chocolate.  Alcohol.  Stop smoking if you smoke.  Reduce your stress and anxiety. Try:  A method that measures bodily functions so you can learn to control them (biofeedback).  Yoga.  Meditation.  Physical activity such as swimming, jogging, or walking.  Get plenty of rest and sleep. GET HELP RIGHT AWAY IF:   You have chest pain.  You feel short of breath.  You have a very bad headache.  You feel dizzy or pass out (faint).  Your fast or irregular heartbeat continues after 24 hours.  Your palpitations occur more often. MAKE SURE YOU:   Understand these instructions.  Will watch your condition.  Will get help right away if you are not doing well or get worse. Document Released: 07/14/2008 Document Revised: 04/05/2012 Document Reviewed: 12/04/2011 Exit[employer] Patient Information 2014 [employer].  

## 2014-08-20 ENCOUNTER — Encounter (HOSPITAL_COMMUNITY): Payer: Self-pay | Admitting: Emergency Medicine

## 2015-06-08 IMAGING — CR DG CHEST 2V
2 series · 2 of 2 positions shown · non-contrast
Comparison: None.

CLINICAL DATA: Tachycardia and fatigue.

EXAM:
CHEST  2 VIEW

[w chest pa]
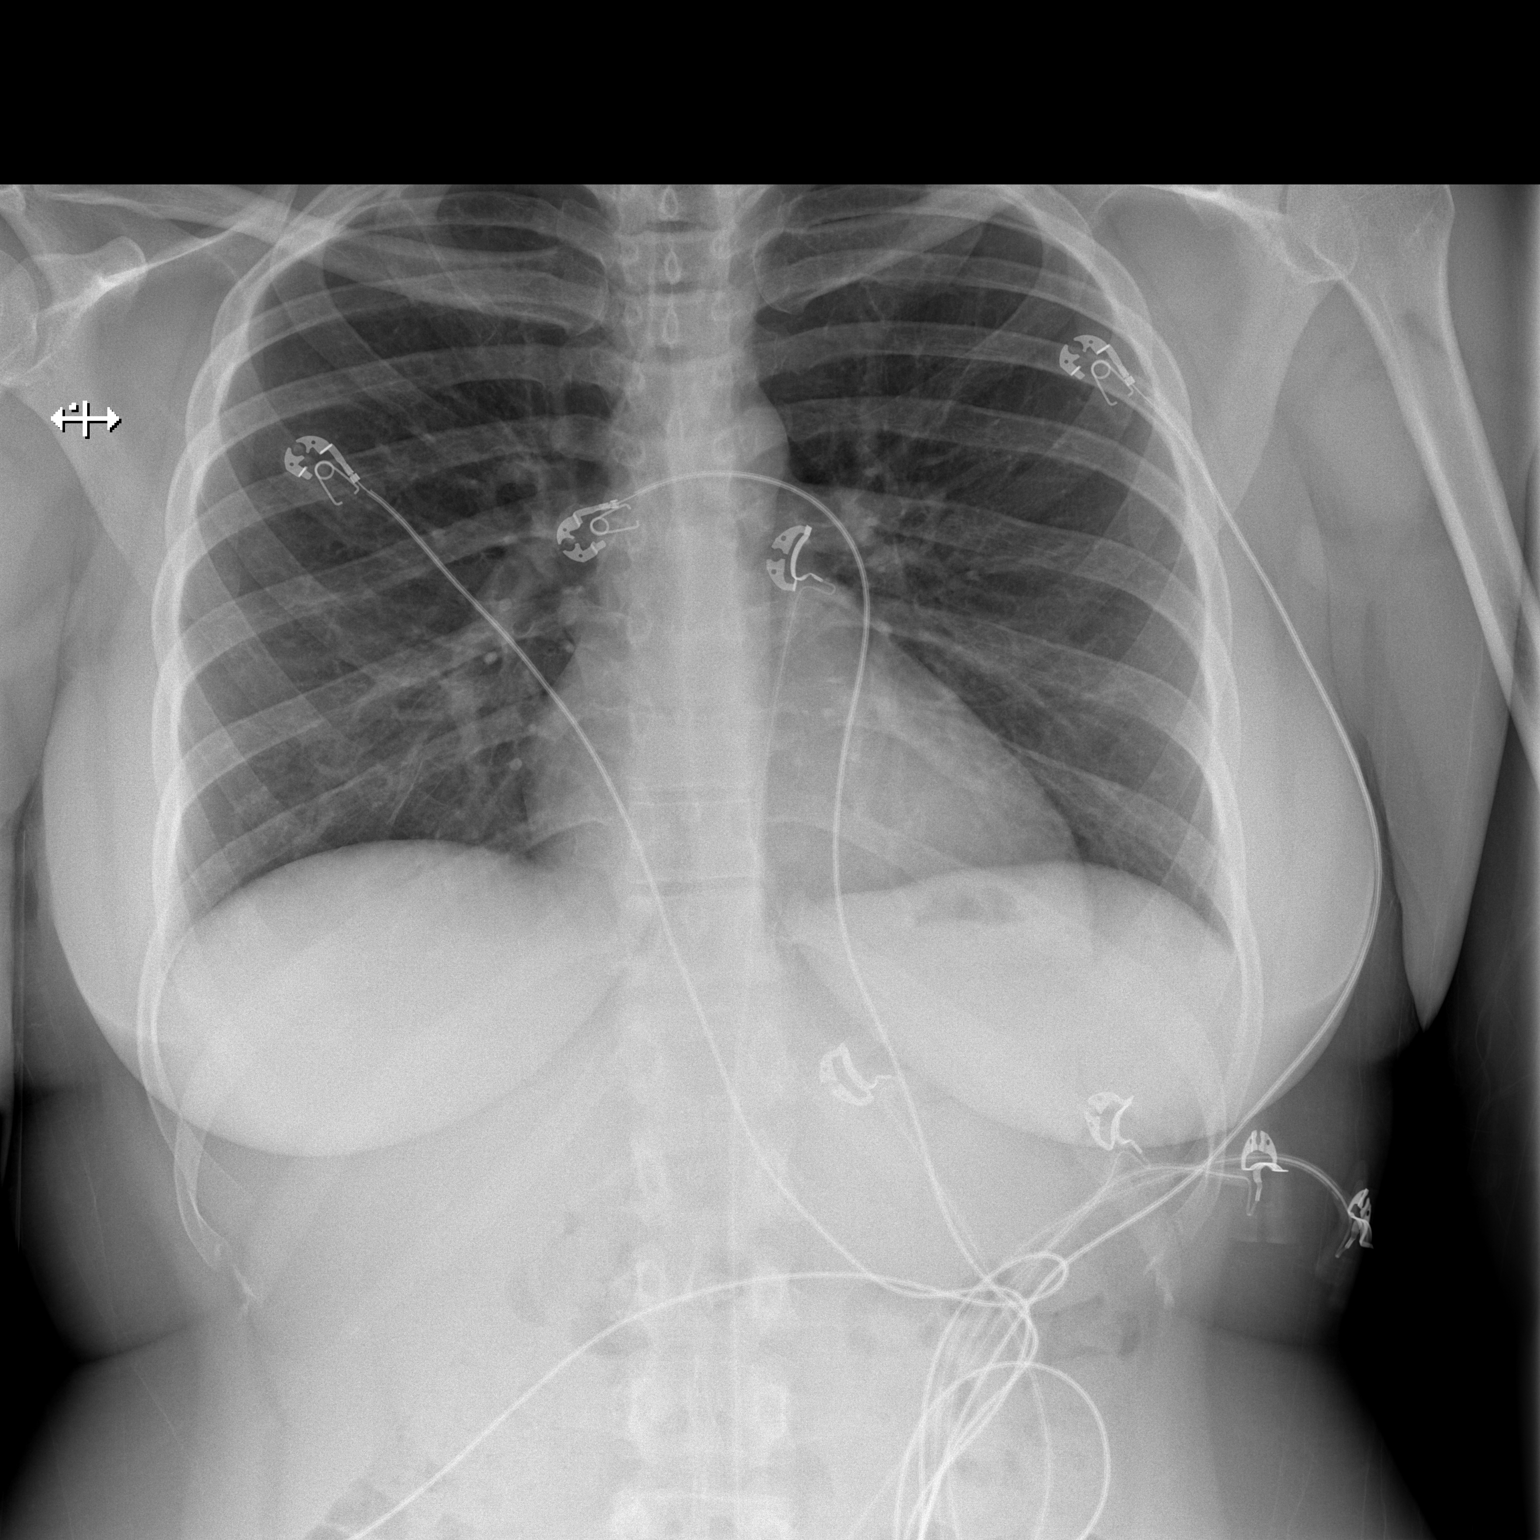

[w chest lat]
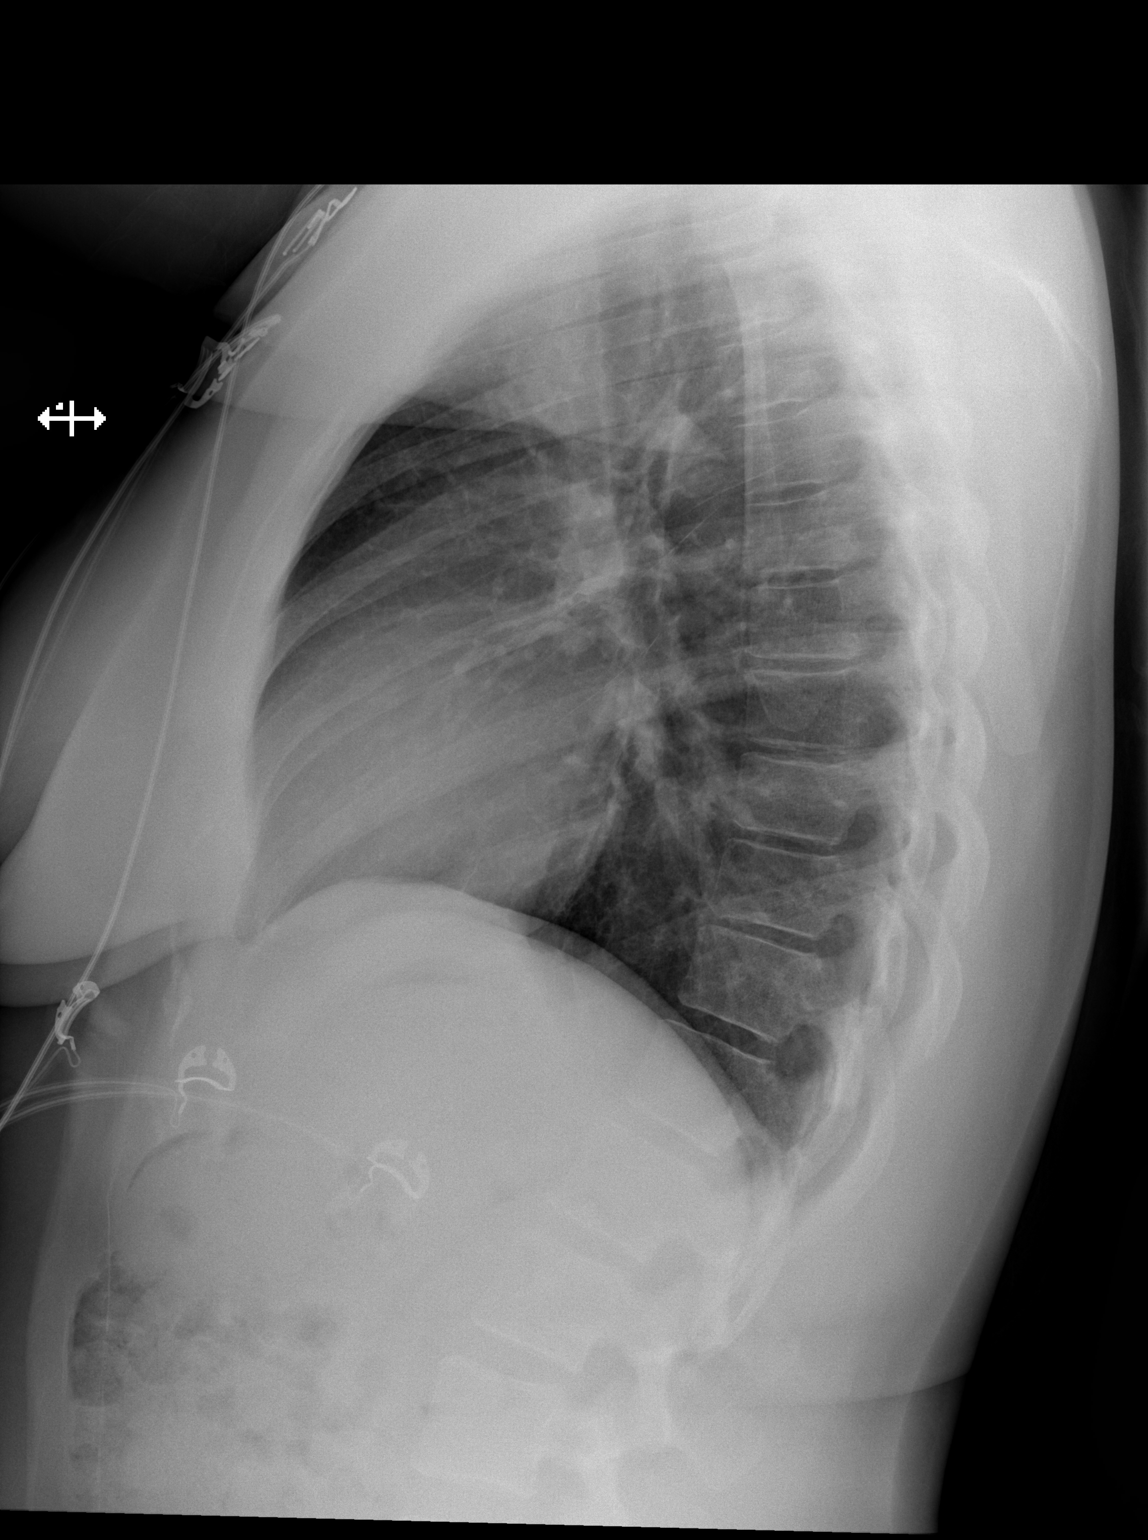

[2 of 2 positions shown; findings below may reference images not displayed]

FINDINGS: The cardiac silhouette, mediastinal and hilar contours are normal.
The lungs are clear. No pleural effusion. The bony thorax is intact.
IMPRESSION: No acute cardiopulmonary findings.

## 2016-07-08 ENCOUNTER — Other Ambulatory Visit: Payer: Self-pay | Admitting: Obstetrics and Gynecology

## 2016-07-08 DIAGNOSIS — R310 Gross hematuria: Secondary | ICD-10-CM

## 2016-07-08 DIAGNOSIS — R1032 Left lower quadrant pain: Secondary | ICD-10-CM

## 2016-07-10 ENCOUNTER — Ambulatory Visit
Admission: RE | Admit: 2016-07-10 | Discharge: 2016-07-10 | Disposition: A | Discharge status: Home / Self Care | Payer: Medicare Other | Source: Ambulatory Visit | Attending: Obstetrics and Gynecology | Admitting: Obstetrics and Gynecology

## 2016-07-10 DIAGNOSIS — R1032 Left lower quadrant pain: Secondary | ICD-10-CM

## 2016-07-10 DIAGNOSIS — R310 Gross hematuria: Secondary | ICD-10-CM

## 2016-07-15 ENCOUNTER — Other Ambulatory Visit: Payer: Self-pay | Admitting: Family Medicine

## 2016-07-15 DIAGNOSIS — R319 Hematuria, unspecified: Secondary | ICD-10-CM

## 2016-07-15 DIAGNOSIS — R1032 Left lower quadrant pain: Secondary | ICD-10-CM

## 2016-07-16 ENCOUNTER — Ambulatory Visit
Admission: RE | Admit: 2016-07-16 | Discharge: 2016-07-16 | Disposition: A | Discharge status: Home / Self Care | Payer: Medicare Other | Source: Ambulatory Visit | Attending: Family Medicine | Admitting: Family Medicine

## 2016-07-16 DIAGNOSIS — R319 Hematuria, unspecified: Secondary | ICD-10-CM

## 2016-07-16 DIAGNOSIS — R1032 Left lower quadrant pain: Secondary | ICD-10-CM

## 2017-02-10 ENCOUNTER — Other Ambulatory Visit: Payer: Self-pay | Admitting: Family Medicine

## 2017-02-19 ENCOUNTER — Other Ambulatory Visit: Payer: Self-pay | Admitting: Family Medicine

## 2017-02-19 DIAGNOSIS — R1013 Epigastric pain: Secondary | ICD-10-CM

## 2017-02-26 ENCOUNTER — Ambulatory Visit
Admission: RE | Admit: 2017-02-26 | Discharge: 2017-02-26 | Disposition: A | Discharge status: Home / Self Care | Payer: Medicare Other | Source: Ambulatory Visit | Attending: Family Medicine | Admitting: Family Medicine

## 2017-02-26 DIAGNOSIS — R1013 Epigastric pain: Secondary | ICD-10-CM

## 2019-11-22 ENCOUNTER — Ambulatory Visit: Payer: Medicare Other | Attending: Internal Medicine

## 2019-11-22 DIAGNOSIS — Z20822 Contact with and (suspected) exposure to covid-19: Secondary | ICD-10-CM

## 2019-11-23 LAB — NOVEL CORONAVIRUS, NAA: SARS-CoV-2, NAA: NOT DETECTED

## 2020-02-27 ENCOUNTER — Emergency Department (HOSPITAL_COMMUNITY): Payer: Medicare Other

## 2020-02-27 ENCOUNTER — Emergency Department (HOSPITAL_COMMUNITY)
Admission: EM | Admit: 2020-02-27 | Discharge: 2020-02-27 | Disposition: A | Discharge status: Home / Self Care | Payer: Medicare Other | Attending: Emergency Medicine | Admitting: Emergency Medicine

## 2020-02-27 ENCOUNTER — Other Ambulatory Visit: Payer: Self-pay

## 2020-02-27 ENCOUNTER — Encounter (HOSPITAL_COMMUNITY): Payer: Self-pay | Admitting: Emergency Medicine

## 2020-02-27 DIAGNOSIS — R030 Elevated blood-pressure reading, without diagnosis of hypertension: Secondary | ICD-10-CM | POA: Diagnosis not present

## 2020-02-27 DIAGNOSIS — R519 Headache, unspecified: Secondary | ICD-10-CM | POA: Diagnosis present

## 2020-02-27 DIAGNOSIS — Z79899 Other long term (current) drug therapy: Secondary | ICD-10-CM | POA: Diagnosis not present

## 2020-02-27 MED ORDER — ACETAMINOPHEN 500 MG PO TABS
1000.0000 mg | ORAL_TABLET | Freq: Once | ORAL | Status: AC
  Administered 2020-02-27: 1000 mg via ORAL
  Filled 2020-02-27: qty 2

## 2020-02-27 NOTE — ED Notes (Signed)
Discharge paperwork reviewed with pt.  Pt verbalized understanding, ambulatory at discharge.

## 2020-02-27 NOTE — ED Provider Notes (Signed)
[facility] DEPT Provider Note   CSN: [id number] Arrival date & time: 02/27/20  6160     History Chief Complaint  Patient presents with  . Headache    Leslie Zhang is a 44 y.o. female.  Patient c/o headache. Symptoms acute onset this AM, at rest, dull, moderate-sev, diffuse, non radiating. States hx migraines, and took her normal meds. Also took her bp and states was in 200/ range, which alarmed her, as no hx htn. +fam hx htn. Currently headache is improved. No neck pain or stiffness. No eye pain or change in vision. No change in speech.  No numbness/weakness. No change in balance, coordination, or normal functional ability. Denies sinus pain or drainage. No syncope. No recent head trauma. No fever or chills. No vomiting.   The history is provided by the patient.  Headache Associated symptoms: no abdominal pain, no eye pain, no fever, no neck pain, no neck stiffness, no numbness, no vomiting and no weakness        Past Medical History:  Diagnosis Date  . GERD (gastroesophageal reflux disease)   . PONV (postoperative nausea and vomiting)   . Seizures ([place])    last sz 1 yr ago    There are no problems to display for this patient.   Past Surgical History:  Procedure Laterality Date  . BILATERAL SALPINGECTOMY Bilateral 12/12/2012   Procedure: BILATERAL SALPINGECTOMY;  Surgeon: Marvene Staff, MD;  Location: [facility];  Service: Gynecology;  Laterality: Bilateral;  . BRAIN SURGERY  2008   left temporal lobe for seizures.   . ROBOTIC ASSISTED TOTAL HYSTERECTOMY N/A 12/12/2012   Procedure: ROBOTIC ASSISTED TOTAL HYSTERECTOMY;  Surgeon: Marvene Staff, MD;  Location: [facility];  Service: Gynecology;  Laterality: N/A;  3 hrs.  . TUBAL LIGATION  2000     OB History    Gravida  2   Para  2   Term  2   Preterm  0   AB      Living  2     SAB  0   TAB  0   Ectopic  0   Multiple  0   Live Births  2           Family  History  Problem Relation Age of Onset  . Asthma Mother   . Hypertension Mother   . Asthma Brother   . Hypertension Brother   . Asthma Maternal Grandmother   . Diabetes Maternal Grandmother   . Hypertension Maternal Grandmother   . Diabetes Maternal Grandfather     Social History   Tobacco Use  . Smoking status: Never Smoker  . Smokeless tobacco: Never Used  Substance Use Topics  . Alcohol use: No  . Drug use: No    Home Medications Prior to Admission medications   Medication Sig Start Date End Date Taking? Authorizing Provider  Ascorbic Acid (VITAMIN C PO) Take 1 tablet by mouth daily.   Yes [provider]  cholecalciferol (VITAMIN D) 1000 UNITS tablet Take 2,000 Units by mouth daily.   Yes [provider]  Cyanocobalamin (B-12 PO) Take 1 tablet by mouth daily.   Yes [provider]  DEXILANT 60 MG capsule Take 60 mg by mouth daily. 02/04/20  Yes [provider]  levETIRAcetam (KEPPRA) 500 MG tablet Take 1,000 mg by mouth 2 (two) times daily.    Yes [provider]  Multiple Vitamins-Minerals (ZINC PO) Take 1 tablet by mouth daily.  Yes [provider]  Oxcarbazepine (TRILEPTAL) 300 MG tablet Take 450 mg by mouth 2 (two) times daily. Take 1 and 1/2 tabs twice daily   Yes [provider]  SUMAtriptan (IMITREX) 50 MG tablet Take 50 mg by mouth as needed for migraine. 01/10/20  Yes [provider]  cyclobenzaprine (FEXMID) 7.5 MG tablet Take 1 tablet (7.5 mg total) by mouth 3 (three) times daily as needed for muscle spasms. Patient not taking: Reported on 02/27/2020 02/14/14   Linwood Dibbles, MD  naproxen (NAPROSYN) 500 MG tablet Take 1 tablet (500 mg total) by mouth 2 (two) times daily. Patient not taking: Reported on 02/27/2020 02/14/14   Linwood Dibbles, MD    Allergies    Contrast media [iodinated diagnostic agents] and Lamictal [lamotrigine]  Review of Systems   Review of Systems  Constitutional: Negative for  chills and fever.  HENT: Negative for sinus pain.   Eyes: Negative for pain, redness and visual disturbance.  Respiratory: Negative for shortness of breath.   Cardiovascular: Negative for chest pain.  Gastrointestinal: Negative for abdominal pain and vomiting.  Genitourinary: Negative for flank pain.  Musculoskeletal: Negative for neck pain and neck stiffness.  Skin: Negative for rash.  Neurological: Positive for headaches. Negative for syncope, speech difficulty, weakness and numbness.  Hematological: Does not bruise/bleed easily.  Psychiatric/Behavioral: Negative for confusion.    Physical Exam Updated Vital Signs BP (!) 153/95   Pulse 69   Temp 98.4 F (36.9 C) (Oral)   Resp 16   LMP 07/29/2011   SpO2 100%   Physical Exam Vitals and nursing note reviewed.  Constitutional:      General: She is not in acute distress.    Appearance: Normal appearance. She is well-developed. She is not diaphoretic.  HENT:     Head: Atraumatic.     Comments: No sinus or temporal tenderness.    Nose: Nose normal.     Mouth/Throat:     Mouth: Mucous membranes are moist.  Eyes:     General: No scleral icterus.    Conjunctiva/sclera: Conjunctivae normal.     Pupils: Pupils are equal, round, and reactive to light.  Neck:     Thyroid: No thyromegaly.     Trachea: No tracheal deviation.     Comments: No stiffness or rigidity.  Cardiovascular:     Rate and Rhythm: Normal rate and regular rhythm.     Pulses: Normal pulses.     Heart sounds: Normal heart sounds. No murmur. No friction rub. No gallop.   Pulmonary:     Effort: Pulmonary effort is normal. No respiratory distress.     Breath sounds: Normal breath sounds.  Abdominal:     General: Bowel sounds are normal. There is no distension.     Palpations: Abdomen is soft.     Tenderness: There is no abdominal tenderness.  Genitourinary:    Comments: No cva tenderness.  Musculoskeletal:        General: No swelling or tenderness. Normal  range of motion.     Cervical back: Normal range of motion and neck supple. No rigidity. No muscular tenderness.  Skin:    General: Skin is warm and dry.     Findings: No rash.  Neurological:     Mental Status: She is alert and oriented to person, place, and time.     Cranial Nerves: No cranial nerve deficit.     Comments: Alert, speech normal. Motor intact bil, stre 5/5. No pronator drift. Sensation grossly  intact. Steady gait.   Psychiatric:        Mood and Affect: Mood normal.     ED Results / Procedures / Treatments   Labs (all labs ordered are listed, but only abnormal results are displayed) Labs Reviewed - No data to display  EKG None  Radiology CT HEAD WO CONTRAST  Result Date: 02/27/2020 CLINICAL DATA:  Headache EXAM: CT HEAD WITHOUT CONTRAST TECHNIQUE: Contiguous axial images were obtained from the base of the skull through the vertex without intravenous contrast. COMPARISON:  03/27/2007 FINDINGS: Brain: Low-density in the left temporal lobe most compatible with postoperative encephalomalacia. No acute intracranial abnormality. Specifically, no hemorrhage, hydrocephalus, mass lesion, acute infarction, or significant intracranial injury. Vascular: No hyperdense vessel or unexpected calcification. Skull: Prior left craniotomy. Sinuses/Orbits: No acute findings Other: None IMPRESSION: No acute intracranial abnormality. Electronically Signed   By: Charlett Nose M.D.   On: 02/27/2020 13:38    Procedures Procedures (including critical care time)  Medications Ordered in ED Medications  acetaminophen (TYLENOL) tablet 1,000 mg (has no administration in time range)    ED Course  I have reviewed the triage vital signs and the nursing notes.  Pertinent labs & imaging results that were available during my care of the patient were reviewed by me and considered in my medical decision making (see chart for details).    MDM Rules/Calculators/A&P                     Acetaminophen  po. Imaging ordered.  Reviewed nursing notes and prior charts for additional history.   CT reviewed/interpreted by me - no hem.   Po fluids.   Recheck bp, elevated, but not requiring emergent tx/lowering.   Recheck pt, headache resolved. No nv.   Rec pcp f/u headaches, elevated bp.   Return precautions provided.      Final Clinical Impression(s) / ED Diagnoses Final diagnoses:  None    Rx / DC Orders ED Discharge Orders    None       Cathren Laine, MD 02/27/20 1402

## 2020-02-27 NOTE — Discharge Instructions (Addendum)
It was our pleasure to provide your ER care today - we hope that you feel better.  Rest. Drink plenty of fluids.  Take your headache medication as need.   Your blood pressure is elevated today - limit salt intake, and follow up with primary care doctor in the next 1-2 weeks.   Return to ER if worse, new symptoms, fevers, new or severe pain, persistent vomiting, numbness/weakness, chest pain, trouble breathing, or other concern.

## 2020-02-27 NOTE — ED Triage Notes (Signed)
Patient here from home reporting headache that started 3am. Reports that her BP was "in the 200s" at home. Denies hx of hypertension .

## 2020-11-21 DIAGNOSIS — R519 Headache, unspecified: Secondary | ICD-10-CM | POA: Diagnosis not present

## 2020-11-21 DIAGNOSIS — R030 Elevated blood-pressure reading, without diagnosis of hypertension: Secondary | ICD-10-CM | POA: Diagnosis not present

## 2021-06-18 DIAGNOSIS — K5900 Constipation, unspecified: Secondary | ICD-10-CM | POA: Diagnosis not present

## 2021-06-18 DIAGNOSIS — K219 Gastro-esophageal reflux disease without esophagitis: Secondary | ICD-10-CM | POA: Diagnosis not present

## 2021-06-18 DIAGNOSIS — R109 Unspecified abdominal pain: Secondary | ICD-10-CM | POA: Diagnosis not present

## 2021-08-12 DIAGNOSIS — K648 Other hemorrhoids: Secondary | ICD-10-CM | POA: Diagnosis not present

## 2021-08-12 DIAGNOSIS — K5730 Diverticulosis of large intestine without perforation or abscess without bleeding: Secondary | ICD-10-CM | POA: Diagnosis not present

## 2021-08-12 DIAGNOSIS — Z1211 Encounter for screening for malignant neoplasm of colon: Secondary | ICD-10-CM | POA: Diagnosis not present

## 2021-08-12 DIAGNOSIS — Q438 Other specified congenital malformations of intestine: Secondary | ICD-10-CM | POA: Diagnosis not present

## 2021-10-28 DIAGNOSIS — K648 Other hemorrhoids: Secondary | ICD-10-CM | POA: Diagnosis not present

## 2021-11-11 DIAGNOSIS — Z03818 Encounter for observation for suspected exposure to other biological agents ruled out: Secondary | ICD-10-CM | POA: Diagnosis not present

## 2021-11-11 DIAGNOSIS — Z20822 Contact with and (suspected) exposure to covid-19: Secondary | ICD-10-CM | POA: Diagnosis not present

## 2021-12-02 DIAGNOSIS — K648 Other hemorrhoids: Secondary | ICD-10-CM | POA: Diagnosis not present

## 2022-01-02 DIAGNOSIS — Z1231 Encounter for screening mammogram for malignant neoplasm of breast: Secondary | ICD-10-CM | POA: Diagnosis not present

## 2022-01-06 DIAGNOSIS — K648 Other hemorrhoids: Secondary | ICD-10-CM | POA: Diagnosis not present

## 2022-02-23 DIAGNOSIS — K648 Other hemorrhoids: Secondary | ICD-10-CM | POA: Diagnosis not present

## 2022-04-16 DIAGNOSIS — R2990 Unspecified symptoms and signs involving the nervous system: Secondary | ICD-10-CM | POA: Diagnosis not present

## 2022-04-16 DIAGNOSIS — Z9889 Other specified postprocedural states: Secondary | ICD-10-CM | POA: Diagnosis not present

## 2022-04-16 DIAGNOSIS — Z79899 Other long term (current) drug therapy: Secondary | ICD-10-CM | POA: Diagnosis not present

## 2022-06-03 DIAGNOSIS — Z03818 Encounter for observation for suspected exposure to other biological agents ruled out: Secondary | ICD-10-CM | POA: Diagnosis not present

## 2022-06-03 DIAGNOSIS — J069 Acute upper respiratory infection, unspecified: Secondary | ICD-10-CM | POA: Diagnosis not present

## 2022-06-03 DIAGNOSIS — G43909 Migraine, unspecified, not intractable, without status migrainosus: Secondary | ICD-10-CM | POA: Diagnosis not present

## 2022-08-11 DIAGNOSIS — K219 Gastro-esophageal reflux disease without esophagitis: Secondary | ICD-10-CM | POA: Diagnosis not present

## 2022-08-11 DIAGNOSIS — K5900 Constipation, unspecified: Secondary | ICD-10-CM | POA: Diagnosis not present

## 2022-08-11 DIAGNOSIS — R109 Unspecified abdominal pain: Secondary | ICD-10-CM | POA: Diagnosis not present

## 2022-08-18 DIAGNOSIS — R300 Dysuria: Secondary | ICD-10-CM | POA: Diagnosis not present

## 2022-09-09 DIAGNOSIS — R3129 Other microscopic hematuria: Secondary | ICD-10-CM | POA: Diagnosis not present

## 2022-09-09 DIAGNOSIS — R8290 Unspecified abnormal findings in urine: Secondary | ICD-10-CM | POA: Diagnosis not present

## 2022-09-09 DIAGNOSIS — M545 Low back pain, unspecified: Secondary | ICD-10-CM | POA: Diagnosis not present

## 2022-10-30 DIAGNOSIS — E559 Vitamin D deficiency, unspecified: Secondary | ICD-10-CM | POA: Diagnosis not present

## 2022-10-30 DIAGNOSIS — K5900 Constipation, unspecified: Secondary | ICD-10-CM | POA: Diagnosis not present

## 2022-10-30 DIAGNOSIS — Z136 Encounter for screening for cardiovascular disorders: Secondary | ICD-10-CM | POA: Diagnosis not present

## 2022-10-30 DIAGNOSIS — Z0000 Encounter for general adult medical examination without abnormal findings: Secondary | ICD-10-CM | POA: Diagnosis not present

## 2022-10-30 DIAGNOSIS — Z79899 Other long term (current) drug therapy: Secondary | ICD-10-CM | POA: Diagnosis not present

## 2022-10-30 DIAGNOSIS — G43909 Migraine, unspecified, not intractable, without status migrainosus: Secondary | ICD-10-CM | POA: Diagnosis not present

## 2022-10-30 DIAGNOSIS — K219 Gastro-esophageal reflux disease without esophagitis: Secondary | ICD-10-CM | POA: Diagnosis not present

## 2022-10-30 DIAGNOSIS — M62838 Other muscle spasm: Secondary | ICD-10-CM | POA: Diagnosis not present

## 2022-10-30 DIAGNOSIS — Z13220 Encounter for screening for lipoid disorders: Secondary | ICD-10-CM | POA: Diagnosis not present

## 2022-10-30 DIAGNOSIS — Z23 Encounter for immunization: Secondary | ICD-10-CM | POA: Diagnosis not present

## 2023-02-10 DIAGNOSIS — K648 Other hemorrhoids: Secondary | ICD-10-CM | POA: Diagnosis not present

## 2023-03-11 DIAGNOSIS — R3129 Other microscopic hematuria: Secondary | ICD-10-CM | POA: Diagnosis not present

## 2023-03-11 DIAGNOSIS — M7918 Myalgia, other site: Secondary | ICD-10-CM | POA: Diagnosis not present

## 2023-04-02 DIAGNOSIS — K648 Other hemorrhoids: Secondary | ICD-10-CM | POA: Diagnosis not present

## 2023-04-27 DIAGNOSIS — K317 Polyp of stomach and duodenum: Secondary | ICD-10-CM | POA: Diagnosis not present

## 2023-04-27 DIAGNOSIS — K649 Unspecified hemorrhoids: Secondary | ICD-10-CM | POA: Diagnosis not present

## 2023-04-27 DIAGNOSIS — D649 Anemia, unspecified: Secondary | ICD-10-CM | POA: Diagnosis not present

## 2023-04-27 DIAGNOSIS — K5900 Constipation, unspecified: Secondary | ICD-10-CM | POA: Diagnosis not present

## 2023-04-27 DIAGNOSIS — R109 Unspecified abdominal pain: Secondary | ICD-10-CM | POA: Diagnosis not present

## 2023-05-31 DIAGNOSIS — K2930 Chronic superficial gastritis without bleeding: Secondary | ICD-10-CM | POA: Diagnosis not present

## 2023-05-31 DIAGNOSIS — K317 Polyp of stomach and duodenum: Secondary | ICD-10-CM | POA: Diagnosis not present

## 2023-05-31 DIAGNOSIS — K3189 Other diseases of stomach and duodenum: Secondary | ICD-10-CM | POA: Diagnosis not present

## 2023-05-31 DIAGNOSIS — B9681 Helicobacter pylori [H. pylori] as the cause of diseases classified elsewhere: Secondary | ICD-10-CM | POA: Diagnosis not present

## 2023-06-04 DIAGNOSIS — B9681 Helicobacter pylori [H. pylori] as the cause of diseases classified elsewhere: Secondary | ICD-10-CM | POA: Diagnosis not present

## 2023-06-04 DIAGNOSIS — K2930 Chronic superficial gastritis without bleeding: Secondary | ICD-10-CM | POA: Diagnosis not present

## 2023-07-05 DIAGNOSIS — Z1231 Encounter for screening mammogram for malignant neoplasm of breast: Secondary | ICD-10-CM | POA: Diagnosis not present

## 2023-09-23 DIAGNOSIS — M62830 Muscle spasm of back: Secondary | ICD-10-CM | POA: Diagnosis not present

## 2023-09-23 DIAGNOSIS — M545 Low back pain, unspecified: Secondary | ICD-10-CM | POA: Diagnosis not present

## 2023-11-04 DIAGNOSIS — Z136 Encounter for screening for cardiovascular disorders: Secondary | ICD-10-CM | POA: Diagnosis not present

## 2023-11-04 DIAGNOSIS — Z0000 Encounter for general adult medical examination without abnormal findings: Secondary | ICD-10-CM | POA: Diagnosis not present

## 2023-11-04 DIAGNOSIS — E559 Vitamin D deficiency, unspecified: Secondary | ICD-10-CM | POA: Diagnosis not present

## 2023-11-04 DIAGNOSIS — G43909 Migraine, unspecified, not intractable, without status migrainosus: Secondary | ICD-10-CM | POA: Diagnosis not present

## 2024-03-30 DIAGNOSIS — M79651 Pain in right thigh: Secondary | ICD-10-CM | POA: Diagnosis not present

## 2024-06-27 DIAGNOSIS — R569 Unspecified convulsions: Secondary | ICD-10-CM | POA: Diagnosis not present

## 2024-07-17 DIAGNOSIS — K5900 Constipation, unspecified: Secondary | ICD-10-CM | POA: Diagnosis not present

## 2024-07-17 DIAGNOSIS — D649 Anemia, unspecified: Secondary | ICD-10-CM | POA: Diagnosis not present

## 2024-07-17 DIAGNOSIS — R109 Unspecified abdominal pain: Secondary | ICD-10-CM | POA: Diagnosis not present

## 2024-07-31 DIAGNOSIS — Z1231 Encounter for screening mammogram for malignant neoplasm of breast: Secondary | ICD-10-CM | POA: Diagnosis not present

## 2024-09-19 ENCOUNTER — Emergency Department (HOSPITAL_COMMUNITY)
Admission: EM | Admit: 2024-09-19 | Discharge: 2024-09-19 | Disposition: A | Discharge status: Home / Self Care | Attending: Emergency Medicine | Admitting: Emergency Medicine

## 2024-09-19 ENCOUNTER — Encounter (HOSPITAL_COMMUNITY): Payer: Self-pay | Admitting: Emergency Medicine

## 2024-09-19 DIAGNOSIS — M79661 Pain in right lower leg: Secondary | ICD-10-CM | POA: Insufficient documentation

## 2024-09-19 DIAGNOSIS — M79604 Pain in right leg: Secondary | ICD-10-CM

## 2024-09-19 LAB — CBC WITH DIFFERENTIAL/PLATELET
Abs Immature Granulocytes: 0.01 K/uL (ref 0.00–0.07)
Basophils Absolute: 0.1 K/uL (ref 0.0–0.1)
Basophils Relative: 1 %
Eosinophils Absolute: 0 K/uL (ref 0.0–0.5)
Eosinophils Relative: 0 %
HCT: 39 % (ref 36.0–46.0)
Hemoglobin: 12.9 g/dL (ref 12.0–15.0)
Immature Granulocytes: 0 %
Lymphocytes Relative: 40 %
Lymphs Abs: 2.2 K/uL (ref 0.7–4.0)
MCH: 29.7 pg (ref 26.0–34.0)
MCHC: 33.1 g/dL (ref 30.0–36.0)
MCV: 89.7 fL (ref 80.0–100.0)
Monocytes Absolute: 0.5 K/uL (ref 0.1–1.0)
Monocytes Relative: 8 %
Neutro Abs: 2.7 K/uL (ref 1.7–7.7)
Neutrophils Relative %: 51 %
Platelets: 393 K/uL (ref 150–400)
RBC: 4.35 MIL/uL (ref 3.87–5.11)
RDW: 12.2 % (ref 11.5–15.5)
WBC: 5.4 K/uL (ref 4.0–10.5)
nRBC: 0 % (ref 0.0–0.2)

## 2024-09-19 LAB — BASIC METABOLIC PANEL WITH GFR
Anion gap: 10 (ref 5–15)
BUN: 9 mg/dL (ref 6–20)
CO2: 24 mmol/L (ref 22–32)
Calcium: 8.8 mg/dL — ABNORMAL LOW (ref 8.9–10.3)
Chloride: 103 mmol/L (ref 98–111)
Creatinine, Ser: 0.79 mg/dL (ref 0.44–1.00)
GFR, Estimated: >60 mL/min (ref >60)
Glucose, Bld: 124 mg/dL — ABNORMAL HIGH (ref 70–99)
Potassium: 3.7 mmol/L (ref 3.5–5.1)
Sodium: 136 mmol/L (ref 135–145)

## 2024-09-19 LAB — PROTIME-INR
INR: 1.7 — ABNORMAL HIGH (ref 0.8–1.2)
Prothrombin Time: 20.5 s — ABNORMAL HIGH (ref 11.4–15.2)

## 2024-09-19 LAB — CK: Total CK: 112 U/L (ref 38–234)

## 2024-09-19 MED ORDER — HYDROCODONE-ACETAMINOPHEN 5-325 MG PO TABS: 1.0000 | ORAL_TABLET | Freq: Four times a day (QID) | ORAL | 0 refills | Status: AC | PRN

## 2024-09-19 MED ORDER — ENOXAPARIN SODIUM 100 MG/ML IJ SOSY
1.0000 mg/kg | PREFILLED_SYRINGE | Freq: Once | INTRAMUSCULAR | Status: AC
  Administered 2024-09-19: 85 mg via SUBCUTANEOUS
  Filled 2024-09-19: qty 1

## 2024-09-19 MED ORDER — MORPHINE SULFATE (PF) 4 MG/ML IV SOLN
4.0000 mg | Freq: Once | INTRAVENOUS | Status: AC
  Administered 2024-09-19: 4 mg via INTRAVENOUS
  Filled 2024-09-19: qty 1

## 2024-09-19 NOTE — Discharge Instructions (Addendum)
 I have ordered a DVT ultrasound to be done at [facility] tomorrow.  If they do not call you in the morning, please call vascular department.  Please continue taking your Lovenox and Coumadin as prescribed.  If your ultrasound tomorrow showed stable blood clot, you need to talk to your doctor and get a repeat INR in the office in a week to recheck your Coumadin level  See your doctor for follow-up  I have prescribed Vicodin as needed for pain  Return to ER if you have severe leg pain or chest pain or shortness of breath

## 2024-09-19 NOTE — ED Triage Notes (Signed)
 Pt arriving POV with right leg pain associated with recently diagnosed blood clot (11/20). Pt was placed on medications for such but says they aren't working.

## 2024-09-19 NOTE — ED Provider Notes (Signed)
 [facility]GENC[facility] DEPARTMENT AT [facility] Provider Note   CSN: [id number] Arrival date & time: 09/19/24  1316     Patient presents with: Leg Pain (Right)   Leslie Zhang is a 48 y.o. female here with right leg pain.  Patient was recently diagnosed with DVT on November 20.  Patient was put on Lovenox and Coumadin.  Patient states that she has been taking as prescribed and apparently her levels are therapeutic yesterday.  She denies any chest pain or shortness of breath but she has worsening pain in the right lower extremity.   The history is provided by the patient.       Prior to Admission medications   Medication Sig Start Date End Date Taking? Authorizing Provider  Ascorbic Acid (VITAMIN C PO) Take 1 tablet by mouth daily.    [provider]  cholecalciferol (VITAMIN D) 1000 UNITS tablet Take 2,000 Units by mouth daily.    [provider]  Cyanocobalamin  (B-12 PO) Take 1 tablet by mouth daily.    [provider]  cyclobenzaprine  (FEXMID ) 7.5 MG tablet Take 1 tablet (7.5 mg total) by mouth 3 (three) times daily as needed for muscle spasms. Patient not taking: Reported on 02/27/2020 02/14/14   Randol Simmonds, MD  DEXILANT 60 MG capsule Take 60 mg by mouth daily. 02/04/20   [provider]  levETIRAcetam  (KEPPRA ) 500 MG tablet Take 1,000 mg by mouth 2 (two) times daily.     [provider]  Multiple Vitamins-Minerals (ZINC PO) Take 1 tablet by mouth daily.    [provider]  naproxen  (NAPROSYN ) 500 MG tablet Take 1 tablet (500 mg total) by mouth 2 (two) times daily. Patient not taking: Reported on 02/27/2020 02/14/14   Randol Simmonds, MD  Oxcarbazepine  (TRILEPTAL ) 300 MG tablet Take 450 mg by mouth 2 (two) times daily. Take 1 and 1/2 tabs twice daily    [provider]  SUMAtriptan (IMITREX) 50 MG tablet Take 50 mg by mouth as needed for migraine. 01/10/20   [provider]    Allergies: Contrast media  [iodinated contrast media] and Lamictal [lamotrigine]    Review of Systems  Musculoskeletal:        Right calf pain  All other systems reviewed and are negative.   Updated Vital Signs BP (!) 153/98 (BP Location: Right Arm)   Pulse 97   Temp 99.8 F (37.7 C) (Oral)   Resp 18   LMP 07/29/2011   SpO2 99%   Physical Exam Vitals and nursing note reviewed.  Constitutional:      Comments: Uncomfortable  HENT:     Head: Normocephalic.     Nose: Nose normal.     Mouth/Throat:     Mouth: Mucous membranes are moist.  Eyes:     Extraocular Movements: Extraocular movements intact.     Pupils: Pupils are equal, round, and reactive to light.  Cardiovascular:     Rate and Rhythm: Normal rate and regular rhythm.     Pulses: Normal pulses.     Heart sounds: Normal heart sounds.  Pulmonary:     Effort: Pulmonary effort is normal.     Breath sounds: Normal breath sounds.  Abdominal:     General: Abdomen is flat.     Palpations: Abdomen is soft.  Musculoskeletal:     Cervical back: Normal range of motion and neck supple.     Comments: Mild R calf tenderness   Skin:    General: Skin  is warm.     Capillary Refill: Capillary refill takes less than 2 seconds.  Neurological:     General: No focal deficit present.     Mental Status: She is alert and oriented to person, place, and time.  Psychiatric:        Mood and Affect: Mood normal.        Behavior: Behavior normal.     (all labs ordered are listed, but only abnormal results are displayed) Labs Reviewed  CBC WITH DIFFERENTIAL/PLATELET  BASIC METABOLIC PANEL WITH GFR  PROTIME-INR  CK    EKG: None  Radiology: No results found.   Procedures   Medications Ordered in the ED  morphine (PF) 4 MG/ML injection 4 mg (4 mg Intravenous Given 09/19/24 1729)                                    Medical Decision Making [name] Chain is a 48 y.o. female here presenting with right calf pain.  Patient is on Lovenox and Coumadin  already.  Will check PT/INR.  Will check CBC and BMP. Will get a repeat ultrasound to see if there is treatment failure.  6:29 PM Labs showed INR 1.7.  CK level was normal.  Unfortunately it was after hours so unable to get DVT ultrasound today.  Will bring patient for DVT study at the vascular lab tomorrow.  Since her INR was subtherapeutic, I ordered a dose of Lovenox.  Patient is still in the process of adjusting her Coumadin dose for therapeutic INR and is on daily Lovenox.  Will also prescribe pain medicine.  If her ultrasound tomorrow showed stable DVT, anticipate she can be current treatment and get INR checked in a week.  If she has worsening DVT, patient may need to be on different regimen.  Problems Addressed: Pain of right lower extremity: acute illness or injury  Amount and/or Complexity of Data Reviewed Labs: ordered.  Risk Prescription drug management.    Final diagnoses:  None    ED Discharge Orders     None          Patt Alm Macho, MD 09/19/24 [phone]

## 2024-09-20 ENCOUNTER — Emergency Department (HOSPITAL_COMMUNITY)

## 2024-09-20 ENCOUNTER — Other Ambulatory Visit: Payer: Self-pay

## 2024-09-20 ENCOUNTER — Emergency Department (HOSPITAL_COMMUNITY)
Admission: EM | Admit: 2024-09-20 | Discharge: 2024-09-20 | Disposition: A | Discharge status: Home / Self Care | Attending: Emergency Medicine | Admitting: Emergency Medicine

## 2024-09-20 ENCOUNTER — Encounter (HOSPITAL_COMMUNITY): Payer: Self-pay

## 2024-09-20 DIAGNOSIS — I82401 Acute embolism and thrombosis of unspecified deep veins of right lower extremity: Secondary | ICD-10-CM | POA: Diagnosis not present

## 2024-09-20 DIAGNOSIS — Z79899 Other long term (current) drug therapy: Secondary | ICD-10-CM | POA: Insufficient documentation

## 2024-09-20 DIAGNOSIS — R0789 Other chest pain: Secondary | ICD-10-CM

## 2024-09-20 DIAGNOSIS — M79669 Pain in unspecified lower leg: Secondary | ICD-10-CM

## 2024-09-20 DIAGNOSIS — M79661 Pain in right lower leg: Secondary | ICD-10-CM | POA: Diagnosis not present

## 2024-09-20 DIAGNOSIS — Z7901 Long term (current) use of anticoagulants: Secondary | ICD-10-CM | POA: Diagnosis not present

## 2024-09-20 LAB — BASIC METABOLIC PANEL WITH GFR
Anion gap: 12 (ref 5–15)
BUN: 5 mg/dL — ABNORMAL LOW (ref 6–20)
CO2: 25 mmol/L (ref 22–32)
Calcium: 9.1 mg/dL (ref 8.9–10.3)
Chloride: 101 mmol/L (ref 98–111)
Creatinine, Ser: 0.89 mg/dL (ref 0.44–1.00)
GFR, Estimated: >60 mL/min (ref >60)
Glucose, Bld: 105 mg/dL — ABNORMAL HIGH (ref 70–99)
Potassium: 3.8 mmol/L (ref 3.5–5.1)
Sodium: 138 mmol/L (ref 135–145)

## 2024-09-20 LAB — CBC
HCT: 39 % (ref 36.0–46.0)
Hemoglobin: 12.9 g/dL (ref 12.0–15.0)
MCH: 29.7 pg (ref 26.0–34.0)
MCHC: 33.1 g/dL (ref 30.0–36.0)
MCV: 89.7 fL (ref 80.0–100.0)
Platelets: 390 K/uL (ref 150–400)
RBC: 4.35 MIL/uL (ref 3.87–5.11)
RDW: 12 % (ref 11.5–15.5)
WBC: 7 K/uL (ref 4.0–10.5)
nRBC: 0 % (ref 0.0–0.2)

## 2024-09-20 LAB — TROPONIN I (HIGH SENSITIVITY)
Troponin I (High Sensitivity): 2 ng/L (ref <18)
Troponin I (High Sensitivity): <2 ng/L (ref <18)

## 2024-09-20 MED ORDER — METHOCARBAMOL 500 MG PO TABS: 500.0000 mg | ORAL_TABLET | Freq: Three times a day (TID) | ORAL | 0 refills | Status: AC | PRN

## 2024-09-20 MED ORDER — ONDANSETRON 4 MG PO TBDP
8.0000 mg | ORAL_TABLET | Freq: Once | ORAL | Status: AC
  Administered 2024-09-20: 8 mg via ORAL
  Filled 2024-09-20: qty 2

## 2024-09-20 MED ORDER — ENOXAPARIN SODIUM 100 MG/ML IJ SOSY
1.0000 mg/kg | PREFILLED_SYRINGE | Freq: Once | INTRAMUSCULAR | Status: AC
  Administered 2024-09-20: 85 mg via SUBCUTANEOUS
  Filled 2024-09-20: qty 0.85

## 2024-09-20 NOTE — Progress Notes (Signed)
 Right lower ext venous  has been completed. Refer to [facility] under chart review to view preliminary results.   09/20/2024  9:14 AM Noble Cicalese, Ricka BIRCH

## 2024-09-20 NOTE — ED Provider Notes (Signed)
 I took over care of this patient at 7 AM pending remainder of workup/DVT study.  She is having calf pain on exam Physical Exam  BP 113/62   Pulse 62   Temp 98.4 F (36.9 C) (Oral)   Resp 17   LMP 07/29/2011   SpO2 100%   Physical Exam Vitals and nursing note reviewed.  Constitutional:      General: She is not in acute distress.    Appearance: She is well-developed.  HENT:     Head: Normocephalic and atraumatic.  Eyes:     Conjunctiva/sclera: Conjunctivae normal.  Cardiovascular:     Rate and Rhythm: Normal rate and regular rhythm.     Heart sounds: No murmur heard. Pulmonary:     Effort: Pulmonary effort is normal. No respiratory distress.     Breath sounds: Normal breath sounds.  Abdominal:     Palpations: Abdomen is soft.     Tenderness: There is no abdominal tenderness.  Musculoskeletal:        General: No swelling.     Cervical back: Neck supple.  Skin:    General: Skin is warm and dry.     Capillary Refill: Capillary refill takes less than 2 seconds.  Neurological:     Mental Status: She is alert.  Psychiatric:        Mood and Affect: Mood normal.     Procedures  Procedures  ED Course / MDM    Medical Decision Making Patient with negative workup and negative DVT study.  She is having more likely chest pain from a strain.  Advised Tylenol  and will give a prescription for Robaxin  to use as needed.  Advised to stay on her blood thinners and follow-up with her primary care doctor to continue monitor INR and to see her other medications as prescribed.  Advised to return for new or worsening symptoms.  She was comfortably discharged home.  Problems Addressed: Acute deep vein thrombosis (DVT) of right lower extremity, unspecified vein (HCC): self-limited or minor problem Atypical chest pain: undiagnosed new problem with uncertain prognosis Calf pain, unspecified laterality: acute illness or injury  Amount and/or Complexity of Data Reviewed Labs:  ordered. Radiology: ordered.  Risk OTC drugs. Prescription drug management.          Gennaro Duwaine CROME, DO 09/20/24 [phone]

## 2024-09-20 NOTE — Discharge Instructions (Signed)
 Stay on your medications as prescribed. You can take tylenol  or robaxin as needed for pain. Follow up with your primary care doctor next week to discuss your INR and your blood thinners.

## 2024-09-20 NOTE — ED Notes (Signed)
Pt was given discharge instructions and verbalized understanding.

## 2024-09-20 NOTE — ED Provider Notes (Signed)
 [place] EMERGENCY DEPARTMENT AT [facility] Provider Note   CSN: [id number] Arrival date & time: 09/20/24  9688     Patient presents with: Chest Pain   Leslie Zhang is a 48 y.o. female.   Patient presents to the emergency department for evaluation of chest pain.  Patient was just seen at [facility] emergency department with complaints of increasing pain in her right leg.  Patient was diagnosed with a DVT in the right leg on November 20.  She was started on Coumadin with Lovenox  bridge, reports that she has been taking these medications.  Patient was given morphine  for her leg pain prior to discharge from [facility] emergency department.  Since then she has developed acute nausea and vomiting and also left-sided chest pain.  Patient reports that she feels a throbbing in her leg.  When the tightness of the leg gets most severe, she gets a tightness in the left chest.  This chest tightness lasts for a few seconds and then resolves.  No associated shortness of breath.       Prior to Admission medications   Medication Sig Start Date End Date Taking? Authorizing Provider  Ascorbic Acid (VITAMIN C PO) Take 1 tablet by mouth daily.    [provider]  cholecalciferol (VITAMIN D) 1000 UNITS tablet Take 2,000 Units by mouth daily.    [provider]  Cyanocobalamin  (B-12 PO) Take 1 tablet by mouth daily.    [provider]  cyclobenzaprine  (FEXMID ) 7.5 MG tablet Take 1 tablet (7.5 mg total) by mouth 3 (three) times daily as needed for muscle spasms. Patient not taking: Reported on 02/27/2020 02/14/14   Randol Simmonds, MD  DEXILANT 60 MG capsule Take 60 mg by mouth daily. 02/04/20   [provider]  HYDROcodone -acetaminophen  (NORCO/VICODIN) 5-325 MG tablet Take 1 tablet by mouth every 6 (six) hours as needed. 09/19/24   Patt Alm Macho, MD  levETIRAcetam  (KEPPRA ) 500 MG tablet Take 1,000 mg by mouth 2 (two) times daily.     [provider]  Multiple Vitamins-Minerals (ZINC PO) Take 1 tablet by mouth daily.    [provider]  naproxen  (NAPROSYN ) 500 MG tablet Take 1 tablet (500 mg total) by mouth 2 (two) times daily. Patient not taking: Reported on 02/27/2020 02/14/14   Randol Simmonds, MD  Oxcarbazepine  (TRILEPTAL ) 300 MG tablet Take 450 mg by mouth 2 (two) times daily. Take 1 and 1/2 tabs twice daily    [provider]  SUMAtriptan (IMITREX) 50 MG tablet Take 50 mg by mouth as needed for migraine. 01/10/20   [provider]    Allergies: Contrast media [iodinated contrast media] and Lamictal [lamotrigine]    Review of Systems  Updated Vital Signs BP 139/84 (BP Location: Right Arm)   Pulse 76   Temp 98.7 F (37.1 C) (Oral)   Resp 14   LMP 07/29/2011   SpO2 99%   Physical Exam Vitals and nursing note reviewed.  Constitutional:      General: She is not in acute distress.    Appearance: She is well-developed.  HENT:     Head: Normocephalic and atraumatic.     Mouth/Throat:     Mouth: Mucous membranes are moist.  Eyes:     General: Vision grossly intact. Gaze aligned appropriately.     Extraocular Movements: Extraocular movements intact.     Conjunctiva/sclera: Conjunctivae normal.  Cardiovascular:     Rate and Rhythm: Normal rate and regular rhythm.  Pulses: Normal pulses.     Heart sounds: Normal heart sounds, S1 normal and S2 normal. No murmur heard.    No friction rub. No gallop.  Pulmonary:     Effort: Pulmonary effort is normal. No respiratory distress.     Breath sounds: Normal breath sounds.  Abdominal:     General: Bowel sounds are normal.     Palpations: Abdomen is soft.     Tenderness: There is no abdominal tenderness. There is no guarding or rebound.     Hernia: No hernia is present.  Musculoskeletal:        General: No swelling.     Cervical back: Full passive range of motion without pain, normal range of motion and neck supple. No spinous process tenderness or  muscular tenderness. Normal range of motion.     Right lower leg: Tenderness present. No edema.     Left lower leg: No edema.  Skin:    General: Skin is warm and dry.     Capillary Refill: Capillary refill takes less than 2 seconds.     Findings: No ecchymosis, erythema, rash or wound.  Neurological:     General: No focal deficit present.     Mental Status: She is alert and oriented to person, place, and time.     GCS: GCS eye subscore is 4. GCS verbal subscore is 5. GCS motor subscore is 6.     Cranial Nerves: Cranial nerves 2-12 are intact.     Sensory: Sensation is intact.     Motor: Motor function is intact.     Coordination: Coordination is intact.  Psychiatric:        Attention and Perception: Attention normal.        Mood and Affect: Mood normal.        Speech: Speech normal.        Behavior: Behavior normal.     (all labs ordered are listed, but only abnormal results are displayed) Labs Reviewed  BASIC METABOLIC PANEL WITH GFR - Abnormal; Notable for the following components:      Result Value   Glucose, Bld 105 (*)    BUN 5 (*)    All other components within normal limits  CBC  TROPONIN I (HIGH SENSITIVITY)  TROPONIN I (HIGH SENSITIVITY)    EKG: EKG Interpretation Date/Time:  Wednesday September 20 2024 03:23:18 EST Ventricular Rate:  80 PR Interval:  162 QRS Duration:  74 QT Interval:  392 QTC Calculation: 452 R Axis:   14  Text Interpretation: Normal sinus rhythm with sinus arrhythmia Normal ECG When compared with ECG of 14-Feb-2014 18:57, PREVIOUS ECG IS PRESENT Confirmed by Haze Lonni PARAS [phone]) on 09/20/2024 4:16:33 AM  Radiology: [facility] Chest 2 View Result Date: 09/20/2024 EXAM: 2 VIEW(S) XRAY OF THE CHEST 09/20/2024 04:29:00 AM COMPARISON: Chest radiographs 02/14/2014 and earlier. CLINICAL HISTORY: 48 year old female with chest pain. FINDINGS: LINES, TUBES AND DEVICES: EKG leads noted. LUNGS AND PLEURA: No focal pulmonary opacity. No pleural  effusion. No pneumothorax. HEART AND MEDIASTINUM: No acute abnormality of the cardiac and mediastinal silhouettes. BONES AND SOFT TISSUES: No acute osseous abnormality. IMPRESSION: 1. No acute cardiopulmonary abnormality. Electronically signed by: Helayne Hurst MD 09/20/2024 04:42 AM EST RP Workstation: [id number]     Procedures   Medications Ordered in the ED - No data to display  Medical Decision Making Amount and/or Complexity of Data Reviewed Labs: ordered. Radiology: ordered.   Presents emergency department for evaluation of vomiting, chest pain.  Patient likely having nausea and vomiting after being administered morphine for leg pain at her previous ED visit.  She appears well.  Abdominal exam is benign.  Patient complaining of chest pain as well.  EKG, troponin unremarkable.  Pain is very atypical, not felt to be cardiac in origin.  She gets only a few seconds of a tightness in the chest that then resolves, nothing precipitates this.  No shortness of breath.  This does not sound consistent with PE.  Patient has been subtherapeutic with her INR, a small PE probably would not change treatment plan at all.  Patient does have an IV contrast allergy, risk of CT outweighs benefit at this time.  Patient was scheduled for outpatient venous duplex to determine if her clot burden is increased.  Will hold her here in the ED to get that this morning.  Discussed with pharmacy.  Patient appears to be taking Coumadin 5 mg daily currently.  Recommendation would be to increase to 7.5 mg for the next 3 days and then recheck INR.     Final diagnoses:  Atypical chest pain  Acute deep vein thrombosis (DVT) of right lower extremity, unspecified vein [facility])    ED Discharge Orders     None          Haze Lonni PARAS, MD 09/20/24 [phone]

## 2024-09-20 NOTE — ED Triage Notes (Signed)
 Pt states that she began having left sided chest tightness x 30 mins ago. Pt was diagnosed with DVT x 2 weeks ago and states that her right leg gets tight and then her chest tightness follows. Denies SOB

## 2024-10-11 ENCOUNTER — Inpatient Hospital Stay: Admitting: Oncology

## 2024-10-11 ENCOUNTER — Inpatient Hospital Stay

## 2024-10-24 ENCOUNTER — Other Ambulatory Visit: Payer: Self-pay

## 2024-10-24 DIAGNOSIS — R1110 Vomiting, unspecified: Secondary | ICD-10-CM | POA: Insufficient documentation

## 2024-10-24 DIAGNOSIS — G40909 Epilepsy, unspecified, not intractable, without status epilepticus: Secondary | ICD-10-CM | POA: Insufficient documentation

## 2024-10-24 DIAGNOSIS — Z8249 Family history of ischemic heart disease and other diseases of the circulatory system: Secondary | ICD-10-CM | POA: Insufficient documentation

## 2024-10-24 DIAGNOSIS — Z9079 Acquired absence of other genital organ(s): Secondary | ICD-10-CM | POA: Insufficient documentation

## 2024-10-24 DIAGNOSIS — Z833 Family history of diabetes mellitus: Secondary | ICD-10-CM | POA: Insufficient documentation

## 2024-10-24 DIAGNOSIS — I82401 Acute embolism and thrombosis of unspecified deep veins of right lower extremity: Secondary | ICD-10-CM | POA: Insufficient documentation

## 2024-10-24 DIAGNOSIS — M79661 Pain in right lower leg: Secondary | ICD-10-CM | POA: Insufficient documentation

## 2024-10-24 DIAGNOSIS — R0789 Other chest pain: Secondary | ICD-10-CM | POA: Insufficient documentation

## 2024-10-24 DIAGNOSIS — Z825 Family history of asthma and other chronic lower respiratory diseases: Secondary | ICD-10-CM | POA: Insufficient documentation

## 2024-10-24 DIAGNOSIS — Z90710 Acquired absence of both cervix and uterus: Secondary | ICD-10-CM | POA: Insufficient documentation

## 2024-10-24 DIAGNOSIS — Z7901 Long term (current) use of anticoagulants: Secondary | ICD-10-CM | POA: Insufficient documentation

## 2024-10-24 DIAGNOSIS — Z86718 Personal history of other venous thrombosis and embolism: Secondary | ICD-10-CM | POA: Insufficient documentation

## 2024-10-24 DIAGNOSIS — Z79899 Other long term (current) drug therapy: Secondary | ICD-10-CM | POA: Insufficient documentation

## 2024-10-24 DIAGNOSIS — Z91041 Radiographic dye allergy status: Secondary | ICD-10-CM | POA: Insufficient documentation

## 2024-10-25 ENCOUNTER — Inpatient Hospital Stay

## 2024-10-25 ENCOUNTER — Inpatient Hospital Stay: Attending: Internal Medicine | Admitting: Internal Medicine

## 2024-10-25 ENCOUNTER — Telehealth: Payer: Self-pay

## 2024-10-25 VITALS — BP 153/83 | HR 76 | Temp 97.7°F | Resp 17 | Ht 66.0 in | Wt 191.0 lb

## 2024-10-25 DIAGNOSIS — Z79899 Other long term (current) drug therapy: Secondary | ICD-10-CM | POA: Diagnosis not present

## 2024-10-25 DIAGNOSIS — Z8249 Family history of ischemic heart disease and other diseases of the circulatory system: Secondary | ICD-10-CM | POA: Diagnosis not present

## 2024-10-25 DIAGNOSIS — I82401 Acute embolism and thrombosis of unspecified deep veins of right lower extremity: Secondary | ICD-10-CM

## 2024-10-25 DIAGNOSIS — Z825 Family history of asthma and other chronic lower respiratory diseases: Secondary | ICD-10-CM | POA: Diagnosis not present

## 2024-10-25 DIAGNOSIS — Z7901 Long term (current) use of anticoagulants: Secondary | ICD-10-CM

## 2024-10-25 DIAGNOSIS — Z833 Family history of diabetes mellitus: Secondary | ICD-10-CM | POA: Diagnosis not present

## 2024-10-25 LAB — CBC WITH DIFFERENTIAL (CANCER CENTER ONLY)
Abs Immature Granulocytes: 0.01 K/uL (ref 0.00–0.07)
Basophils Absolute: 0 K/uL (ref 0.0–0.1)
Basophils Relative: 1 %
Eosinophils Absolute: 0 K/uL (ref 0.0–0.5)
Eosinophils Relative: 0 %
HCT: 37.5 % (ref 36.0–46.0)
Hemoglobin: 13.1 g/dL (ref 12.0–15.0)
Immature Granulocytes: 0 %
Lymphocytes Relative: 39 %
Lymphs Abs: 1.5 K/uL (ref 0.7–4.0)
MCH: 29.8 pg (ref 26.0–34.0)
MCHC: 34.9 g/dL (ref 30.0–36.0)
MCV: 85.2 fL (ref 80.0–100.0)
Monocytes Absolute: 0.4 K/uL (ref 0.1–1.0)
Monocytes Relative: 11 %
Neutro Abs: 2 K/uL (ref 1.7–7.7)
Neutrophils Relative %: 49 %
Platelet Count: 392 K/uL (ref 150–400)
RBC: 4.4 MIL/uL (ref 3.87–5.11)
RDW: 11.6 % (ref 11.5–15.5)
WBC Count: 4 K/uL (ref 4.0–10.5)
nRBC: 0 % (ref 0.0–0.2)

## 2024-10-25 LAB — ANTITHROMBIN III: AntiThromb III Func: 109 % (ref 75–120)

## 2024-10-25 LAB — CMP (CANCER CENTER ONLY)
ALT: 12 U/L (ref 0–44)
AST: 21 U/L (ref 15–41)
Albumin: 4.5 g/dL (ref 3.5–5.0)
Alkaline Phosphatase: 49 U/L (ref 38–126)
Anion gap: 9 (ref 5–15)
BUN: 8 mg/dL (ref 6–20)
CO2: 26 mmol/L (ref 22–32)
Calcium: 9.3 mg/dL (ref 8.9–10.3)
Chloride: 101 mmol/L (ref 98–111)
Creatinine: 0.89 mg/dL (ref 0.44–1.00)
GFR, Estimated: >60 mL/min
Glucose, Bld: 83 mg/dL (ref 70–99)
Potassium: 4.6 mmol/L (ref 3.5–5.1)
Sodium: 136 mmol/L (ref 135–145)
Total Bilirubin: 0.3 mg/dL (ref 0.0–1.2)
Total Protein: 7.7 g/dL (ref 6.5–8.1)

## 2024-10-25 LAB — LACTATE DEHYDROGENASE: LDH: 232 U/L (ref 105–235)

## 2024-10-25 NOTE — Telephone Encounter (Signed)
 Faxed medical records request for doppler performed on 09/07/24 to [facility] with confirmation @ [phone].

## 2024-10-25 NOTE — Progress Notes (Signed)
 "   [facility] [phone]   [phone]  CONSULT NOTE  REFERRING PHYSICIAN: Dr. Elsie Lesches  REASON FOR CONSULTATION:  49 years old African-American female with history of deep venous thrombosis  HPI Leslie Zhang is a 49 y.o. female.   HPI  Discussed the use of AI scribe software for clinical note transcription with the patient, who gave verbal consent to proceed.  History of Present Illness Leslie Zhang is a 49 year old female with epilepsy who presents for evaluation and management of right lower extremity deep vein thrombosis.  In the week prior to Thanksgiving 2025, she developed intermittent sharp and dull pain in the right calf, which progressed over several days to severe pain with ambulation and standing, described as feeling repeatedly stabbed. The pain became so severe that she was unable to walk, prompting medical evaluation.  The patient reports that a vein specialist diagnosed her with a blood clot in the right calf after evaluation, and she recalls undergoing a Doppler ultrasound at that time. She was initially treated with Lovenox  and transitioned to warfarin, with frequent laboratory monitoring. Despite anticoagulation, symptoms persisted, resulting in two emergency department visits for ongoing pain. During the first visit, she received morphine , which caused severe vomiting and chest tightness, attributed to an interaction with her seizure medications.  Anticoagulation was discontinued on October 16, 2024, due to concerns regarding drug interactions between her seizure medications and warfarin. No alternative anticoagulants were prescribed. She was advised to use acetaminophen  for pain, avoid aspirin , and wear compression stockings. The patient reports that a follow-up Doppler ultrasound was performed in early December, and she was told there was no evidence of blood clots in either leg.  Currently, she is not taking  anticoagulant medications. The sharp, dull pain has resolved, but she continues to experience intermittent muscle discomfort in the right calf. She denies chest pain, dyspnea, hemoptysis, or other thromboembolic symptoms. She remains physically active, gardening daily and assisting with her father's care. She has not experienced recent trauma, prolonged immobility, or air travel, though she traveled by car to [place]  three weeks prior to symptom onset, with overnight stays.  She is maintained on Keppra  2000 mg daily and a second seizure medication at 450 mg daily for epilepsy. She does not use hormone therapy, tobacco, alcohol, or illicit drugs. She is on disability due to her seizure disorder.     Past Medical History:  Diagnosis Date   GERD (gastroesophageal reflux disease)    PONV (postoperative nausea and vomiting)    Seizures (HCC)    last sz 1 yr ago      Past Surgical History:  Procedure Laterality Date   BILATERAL SALPINGECTOMY Bilateral 12/12/2012   Procedure: BILATERAL SALPINGECTOMY;  Surgeon: Dickie DELENA Carder, MD;  Location: [facility];  Service: Gynecology;  Laterality: Bilateral;   BRAIN SURGERY  2008   left temporal lobe for seizures.    ROBOTIC ASSISTED TOTAL HYSTERECTOMY N/A 12/12/2012   Procedure: ROBOTIC ASSISTED TOTAL HYSTERECTOMY;  Surgeon: Dickie DELENA Carder, MD;  Location: [facility];  Service: Gynecology;  Laterality: N/A;  3 hrs.   TUBAL LIGATION  2000    Family History  Problem Relation Age of Onset   Asthma Mother    Hypertension Mother    Asthma Brother    Hypertension Brother    Asthma Maternal Grandmother    Diabetes Maternal Grandmother    Hypertension Maternal Grandmother    Diabetes Maternal Grandfather     Social  History Social History[1]  Allergies[2]  Current Outpatient Medications  Medication Sig Dispense Refill   Ascorbic Acid (VITAMIN C PO) Take 1 tablet by mouth daily.     cholecalciferol (VITAMIN D) 1000 UNITS tablet Take 2,000  Units by mouth daily.     Cyanocobalamin  (B-12 PO) Take 1 tablet by mouth daily.     cyclobenzaprine  (FEXMID ) 7.5 MG tablet Take 1 tablet (7.5 mg total) by mouth 3 (three) times daily as needed for muscle spasms. (Patient not taking: Reported on 02/27/2020) 30 tablet 0   DEXILANT 60 MG capsule Take 60 mg by mouth daily.     HYDROcodone -acetaminophen  (NORCO/VICODIN) 5-325 MG tablet Take 1 tablet by mouth every 6 (six) hours as needed. 10 tablet 0   levETIRAcetam  (KEPPRA ) 500 MG tablet Take 1,000 mg by mouth 2 (two) times daily.      Multiple Vitamins-Minerals (ZINC PO) Take 1 tablet by mouth daily.     naproxen  (NAPROSYN ) 500 MG tablet Take 1 tablet (500 mg total) by mouth 2 (two) times daily. (Patient not taking: Reported on 02/27/2020) 30 tablet 0   Oxcarbazepine  (TRILEPTAL ) 300 MG tablet Take 450 mg by mouth 2 (two) times daily. Take 1 and 1/2 tabs twice daily     SUMAtriptan (IMITREX) 50 MG tablet Take 50 mg by mouth as needed for migraine.     No current facility-administered medications for this visit.    Review of Systems  Constitutional: negative Eyes: negative Ears, nose, mouth, throat, and face: negative Respiratory: negative Cardiovascular: negative Gastrointestinal: negative Genitourinary:negative Integument/breast: negative Hematologic/lymphatic: negative Musculoskeletal:negative Neurological: negative Behavioral/Psych: negative Endocrine: negative Allergic/Immunologic: negative  Physical Exam  MJO:jozmu, healthy, no distress, well nourished, and well developed SKIN: skin color, texture, turgor are normal, no rashes or significant lesions HEAD: Normocephalic, No masses, lesions, tenderness or abnormalities EYES: normal, PERRLA, Conjunctiva are pink and non-injected EARS: External ears normal, Canals clear OROPHARYNX:no exudate, no erythema, and lips, buccal mucosa, and tongue normal  NECK: supple, no adenopathy, no JVD LYMPH:  no palpable lymphadenopathy, no  hepatosplenomegaly BREAST:not examined LUNGS: clear to auscultation , and palpation HEART: regular rate & rhythm, no murmurs, and no gallops ABDOMEN:abdomen soft, non-tender, normal bowel sounds, and no masses or organomegaly BACK: Back symmetric, no curvature., No CVA tenderness EXTREMITIES:no joint deformities, effusion, or inflammation, no edema  NEURO: alert & oriented x 3 with fluent speech, no focal motor/sensory deficits  PERFORMANCE STATUS: ECOG 1  LABORATORY DATA: Lab Results  Component Value Date   WBC 7.0 09/20/2024   HGB 12.9 09/20/2024   HCT 39.0 09/20/2024   MCV 89.7 09/20/2024   PLT 390 09/20/2024      Chemistry      Component Value Date/Time   NA 138 09/20/2024 0351   K 3.8 09/20/2024 0351   CL 101 09/20/2024 0351   CO2 25 09/20/2024 0351   BUN 5 (L) 09/20/2024 0351   CREATININE 0.89 09/20/2024 0351      Component Value Date/Time   CALCIUM 9.1 09/20/2024 0351       RADIOGRAPHIC STUDIES: No results found.  ASSESSMENT: This is a very pleasant 49 years old African-American female with a reported history of right deep venous thrombosis on ultrasound Doppler of the right lower extremity on September 07, 2024 but unfortunately I do not have the report for this Doppler that was done at the outside facility.  Will try to get the report for confirmation.  She was treated with initially with Lovenox  before switching to Coumadin which was  the best available option for her at that time because of interaction with her seizure medication with the other agents like Xarelto and Eliquis.  She was treated for around 5 weeks and she has been off treatment since October 16, 2024.   PLAN: I had a lengthy discussion with the patient today about her current condition and treatment options. Assessment and Plan Assessment & Plan Deep vein thrombosis, right lower extremity She was diagnosed with right lower extremity deep vein thrombosis in November 2025, initially managed with  enoxaparin  and transitioned to warfarin. Anticoagulation was discontinued ten days prior due to drug interactions with her antiepileptic medications and lack of efficacy. She is currently asymptomatic for DVT or pulmonary embolism, and repeat Doppler ultrasound in December showed no evidence of DVT. The etiology remains unprovoked, with no identifiable risk factors.  If there is any concerning underlying factor, we will resume her treatment with Coumadin soon but she will need to follow-up with the Coumadin clinic for adjustment of her dose. - Reviewed medication interactions and emphasized the need for pharmacy coordination if future anticoagulation is required due to concomitant antiepileptic therapy. - Planned follow-up in one month to review laboratory results and reassess management. I will also repeat Doppler of the right lower extremity before her upcoming visit in 1 months. The patient was advised to call immediately if she has any other concerning symptoms in the interval.   The patient voices understanding of current disease status and treatment options and is in agreement with the current care plan.  All questions were answered. The patient knows to call the clinic with any problems, questions or concerns. We can certainly see the patient much sooner if necessary.  Thank you so much for allowing me to participate in the care of Leslie Zhang. I will continue to follow up the patient with you and assist in her care.  The total time spent in the appointment was 60 minutes including review of chart and various tests results, discussions about plan of care and coordination of care plan .   Disclaimer: This note was dictated with voice recognition software. Similar sounding words can inadvertently be transcribed and may not be corrected upon review.   Sherrod MARLA Sherrod October 25, 2024, 1:59 PM        [1]  Social History Tobacco Use   Smoking status: Never   Smokeless tobacco:  Never  Substance Use Topics   Alcohol use: No   Drug use: No  [2]  Allergies Allergen Reactions   Contrast Media [Iodinated Contrast Media] Itching and Rash    Severe itching   Lamictal [Lamotrigine] Rash   "

## 2024-10-26 LAB — HOMOCYSTEINE: Homocysteine: 5.7 umol/L (ref 0.0–14.5)

## 2024-10-26 NOTE — Addendum Note (Signed)
 Addended by: CAROLEE LOA DEL on: 10/26/2024 12:56 PM   Modules accepted: Orders

## 2024-10-27 LAB — PROTEIN C, TOTAL: Protein C, Total: 95 % (ref 60–150)

## 2024-10-27 LAB — BETA-2-GLYCOPROTEIN I ABS, IGG/M/A
Beta-2 Glyco I IgG: <9 GPI IgG units (ref 0–20)
Beta-2-Glycoprotein I IgA: <9 GPI IgA units (ref 0–25)
Beta-2-Glycoprotein I IgM: <9 GPI IgM units (ref 0–32)

## 2024-10-28 LAB — PROTEIN S, TOTAL: Protein S Ag, Total: 105 % (ref 60–150)

## 2024-10-28 LAB — LUPUS ANTICOAGULANT PANEL
DRVVT: 34.8 s (ref 0.0–47.0)
PTT Lupus Anticoagulant: 44.7 s — ABNORMAL HIGH (ref 0.0–43.5)

## 2024-10-28 LAB — PROTEIN C ACTIVITY: Protein C Activity: 127 % (ref 73–180)

## 2024-10-28 LAB — HEXAGONAL PHASE PHOSPHOLIPID: Hexagonal Phase Phospholipid: 8 s (ref 0–11)

## 2024-10-28 LAB — PROTEIN S ACTIVITY: Protein S Activity: 71 % (ref 63–140)

## 2024-10-28 LAB — PTT-LA MIX: PTT-LA Mix: 41.4 s — ABNORMAL HIGH (ref 0.0–40.5)

## 2024-10-29 LAB — CARDIOLIPIN ANTIBODIES, IGG, IGM, IGA
Anticardiolipin IgA: <9 U/mL (ref 0–11)
Anticardiolipin IgG: 12 GPL U/mL (ref 0–14)
Anticardiolipin IgM: <9 [MPL'U]/mL (ref 0–12)

## 2024-10-30 LAB — FACTOR 5 LEIDEN

## 2024-10-31 LAB — PROTHROMBIN GENE MUTATION

## 2024-11-01 ENCOUNTER — Ambulatory Visit (HOSPITAL_COMMUNITY)
Admission: RE | Admit: 2024-11-01 | Discharge: 2024-11-01 | Disposition: A | Discharge status: Home / Self Care | Source: Ambulatory Visit | Attending: Internal Medicine | Admitting: Internal Medicine

## 2024-11-01 DIAGNOSIS — I82401 Acute embolism and thrombosis of unspecified deep veins of right lower extremity: Secondary | ICD-10-CM | POA: Insufficient documentation

## 2024-11-03 LAB — NGS JAK2 E12-15/CALR/MPL

## 2024-11-23 ENCOUNTER — Inpatient Hospital Stay: Admitting: Internal Medicine

## 2024-11-23 VITALS — BP 117/91 | HR 81 | Temp 98.2°F | Resp 17 | Ht 66.0 in | Wt 193.7 lb

## 2024-11-23 DIAGNOSIS — I824Z1 Acute embolism and thrombosis of unspecified deep veins of right distal lower extremity: Secondary | ICD-10-CM | POA: Insufficient documentation

## 2024-11-23 DIAGNOSIS — I825Z1 Chronic embolism and thrombosis of unspecified deep veins of right distal lower extremity: Secondary | ICD-10-CM

## 2024-11-23 MED ORDER — WARFARIN SODIUM 5 MG PO TABS: 5.0000 mg | ORAL_TABLET | Freq: Every day | ORAL | 0 refills | Status: AC

## 2024-11-23 NOTE — Progress Notes (Signed)
 "     [facility] [phone]   [phone]  OFFICE PROGRESS NOTE  Arloa Elsie SAUNDERS, MD [phone] [address] [place] [address]  DIAGNOSIS: History of right deep venous thrombosis on ultrasound Doppler of the right lower extremity on September 07, 2024  Hypercoagulable panel showed no underlying genetic or acquired abnormality. JAK2 mutation panel was negative.  PRIOR THERAPY: Short course treatment with Lovenox  followed by Coumadin .  She could not be treated with DOAC secondary to interaction with her seizure medication.  CURRENT THERAPY: None  INTERVAL HISTORY: Leslie Zhang 49 y.o. female returns to the clinic today for follow-up visit. Discussed the use of AI scribe software for clinical note transcription with the patient, who gave verbal consent to proceed.  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 Hypercoagulable workup was negative for genetic or acquired abnormalities. She previously completed a short course of Lovenox  followed by Coumadin , but anticoagulation was discontinued and she has been under observation since.  On the day prior to this visit, she experienced severe, sharp pain in her right leg localized to the area of her prior DVT. At the time of the encounter, the pain had resolved but recurred briefly during discussion. She denies ongoing pain at present.  Repeat Doppler ultrasound of the right lower extremity performed prior to this visit demonstrated a chronic, non-acute residual clot in the same area as her previous DVT.  She is not currently on anticoagulation. She expressed concern regarding dietary restrictions  associated with Coumadin  therapy, as she follows a vegetarian diet with green vegetables as her primary source of nutrition.      MEDICAL HISTORY: Past Medical History:  Diagnosis Date   GERD (gastroesophageal reflux disease)    PONV (postoperative nausea and vomiting)    Seizures (HCC)    last sz 1 yr ago    ALLERGIES:  is allergic to contrast media [iodinated contrast media] and lamictal [lamotrigine].  MEDICATIONS:  Current Outpatient Medications  Medication Sig Dispense Refill   warfarin (COUMADIN ) 5 MG tablet Take 1 tablet (5 mg total) by mouth daily. 30 tablet 0   Ascorbic Acid (VITAMIN C PO) Take 1 tablet by mouth daily.     cholecalciferol (VITAMIN D) 1000 UNITS tablet Take 2,000 Units by mouth daily.     Cyanocobalamin  (B-12 PO) Take 1 tablet by mouth daily.     cyclobenzaprine  (FEXMID ) 7.5 MG tablet Take 1 tablet (7.5 mg total) by mouth 3 (three) times daily as needed for muscle spasms. (Patient not taking: Reported on 02/27/2020) 30 tablet 0   DEXILANT 60 MG capsule Take 60 mg by mouth daily.     HYDROcodone -acetaminophen  (NORCO/VICODIN) 5-325 MG tablet Take 1 tablet by mouth every 6 (six) hours as needed. 10 tablet 0   levETIRAcetam  (KEPPRA ) 500 MG tablet Take 1,000 mg by mouth 2 (two) times daily.      Multiple Vitamins-Minerals (ZINC PO) Take 1 tablet by mouth daily.     naproxen  (NAPROSYN ) 500 MG tablet Take 1 tablet (500 mg total) by mouth 2 (two) times daily. (Patient not taking: Reported on 02/27/2020) 30 tablet 0   Oxcarbazepine  (TRILEPTAL ) 300 MG tablet Take 450 mg by mouth 2 (  two) times daily. Take 1 and 1/2 tabs twice daily     SUMAtriptan (IMITREX) 50 MG tablet Take 50 mg by mouth as needed for migraine.     No current facility-administered medications for this visit.    SURGICAL HISTORY:  Past Surgical History:  Procedure Laterality Date   BILATERAL SALPINGECTOMY Bilateral 12/12/2012   Procedure: BILATERAL SALPINGECTOMY;  Surgeon: Dickie DELENA Carder, MD;   Location: [facility];  Service: Gynecology;  Laterality: Bilateral;   BRAIN SURGERY  2008   left temporal lobe for seizures.    ROBOTIC ASSISTED TOTAL HYSTERECTOMY N/A 12/12/2012   Procedure: ROBOTIC ASSISTED TOTAL HYSTERECTOMY;  Surgeon: Dickie DELENA Carder, MD;  Location: [facility];  Service: Gynecology;  Laterality: N/A;  3 hrs.   TUBAL LIGATION  2000    REVIEW OF SYSTEMS:  A comprehensive review of systems was negative except for: Pain in the right calf   PHYSICAL EXAMINATION: General appearance: alert, cooperative, fatigued, and no distress Head: Normocephalic, without obvious abnormality, atraumatic Neck: no adenopathy, no JVD, supple, symmetrical, trachea midline, and thyroid  not enlarged, symmetric, no tenderness/mass/nodules Lymph nodes: Cervical, supraclavicular, and axillary nodes normal. Resp: clear to auscultation bilaterally Back: symmetric, no curvature. ROM normal. No CVA tenderness. Cardio: regular rate and rhythm, S1, S2 normal, no murmur, click, rub or gallop GI: soft, non-tender; bowel sounds normal; no masses,  no organomegaly Extremities: extremities normal, atraumatic, no cyanosis or edema  ECOG PERFORMANCE STATUS: 1 - Symptomatic but completely ambulatory  Blood pressure (!) 117/91, pulse 81, temperature 98.2 F (36.8 C), temperature source Temporal, resp. rate 17, height 5' 6 (1.676 m), weight 193 lb 11.2 oz (87.9 kg), last menstrual period 07/29/2011, SpO2 100%.  LABORATORY DATA: Lab Results  Component Value Date   WBC 4.0 10/25/2024   HGB 13.1 10/25/2024   HCT 37.5 10/25/2024   MCV 85.2 10/25/2024   PLT 392 10/25/2024      Chemistry      Component Value Date/Time   NA 136 10/25/2024 1344   K 4.6 10/25/2024 1344   CL 101 10/25/2024 1344   CO2 26 10/25/2024 1344   BUN 8 10/25/2024 1344   CREATININE 0.89 10/25/2024 1344      Component Value Date/Time   CALCIUM 9.3 10/25/2024 1344   ALKPHOS 49 10/25/2024 1344   AST 21 10/25/2024 1344   ALT 12  10/25/2024 1344   BILITOT 0.3 10/25/2024 1344       RADIOGRAPHIC STUDIES: VAS US  LOWER EXTREMITY VENOUS (DVT) Result Date: 11/01/2024  Lower Venous DVT Study Patient Name:  Leslie Zhang  Date of Exam:   11/01/2024 Medical Rec #: [id number]           Accession #:    [id number] Date of Birth: [birth year]            Patient Gender: F Patient Age:   64 years Exam Location:  [address] Procedure:      VAS US  LOWER EXTREMITY VENOUS (DVT) Referring Phys: [facility] Ethelda Deangelo --------------------------------------------------------------------------------  Indications: Continued sharp pain in the right calf with some relief since November 2025. Patient reports being told she had a blood clot in her right calf in November 2025-ultrasound imaging from outside facility not found. Follow up ultrasound in December states negative for DVT. She denies any unusual SOB.  Comparison Study: On 09/20/2024, a lower venous duplex showed no evidence of DVT                   in the right  lower extremity. Performing Technologist: Nanetta Shad RVT  Examination Guidelines: A complete evaluation includes B-mode imaging, spectral Doppler, color Doppler, and power Doppler as needed of all accessible portions of each vessel. Bilateral testing is considered an integral part of a complete examination. Limited examinations for reoccurring indications may be performed as noted. The reflux portion of the exam is performed with the patient in reverse Trendelenburg.  +---------+---------------+---------+-----------+---------------+-------------+ RIGHT    CompressibilityPhasicitySpontaneityProperties     Thrombus                                                                 Aging         +---------+---------------+---------+-----------+---------------+-------------+ CFV      Full           Yes      Yes                                     +---------+---------------+---------+-----------+---------------+-------------+ SFJ       Full                    Yes                                     +---------+---------------+---------+-----------+---------------+-------------+ FV Prox  Full                                                            +---------+---------------+---------+-----------+---------------+-------------+ FV Mid   Full           Yes      Yes                                     +---------+---------------+---------+-----------+---------------+-------------+ FV DistalFull                                                            +---------+---------------+---------+-----------+---------------+-------------+ PFV      Full                                                            +---------+---------------+---------+-----------+---------------+-------------+ POP      Full           Yes      Yes        brightly       Chronic  echogenic                    +---------+---------------+---------+-----------+---------------+-------------+ PTV      Full                                                            +---------+---------------+---------+-----------+---------------+-------------+ PERO     Full                                                            +---------+---------------+---------+-----------+---------------+-------------+ Soleal   Full                                                            +---------+---------------+---------+-----------+---------------+-------------+ GSV      Full                                                            +---------+---------------+---------+-----------+---------------+-------------+ TPT      Full           Yes      Yes                                     +---------+---------------+---------+-----------+---------------+-------------+   +----+---------------+---------+-----------+----------+--------------+  LEFTCompressibilityPhasicitySpontaneityPropertiesThrombus Aging +----+---------------+---------+-----------+----------+--------------+ CFV Full           Yes      Yes                                 +----+---------------+---------+-----------+----------+--------------+     Summary: RIGHT: - Findings consistent with chronic deep vein thrombosis involving the right popliteal vein. - No cystic structure found in the popliteal fossa. - All other veins visualized appear fully compressible and demonstrate appropriate Doppler characteristics.  LEFT: - No evidence of common femoral vein obstruction.   *See table(s) above for measurements and observations. Electronically signed by Penne Colorado MD on 11/01/2024 at 5:12:31 PM.    Final     ASSESSMENT AND PLAN: This is a very pleasant 49 years old African-American female with right lower extremity deep venous thrombosis diagnosed in November 2025 and treated for short period subcutaneous Lovenox  followed by Coumadin .  She was not a candidate for DOAC because of interaction with her seizure medication. Repeat Doppler of the lower extremities November 01, 2024 showed chronic deep venous thrombosis of the right popliteal vein.  She has a hypercoagulable workup that was unremarkable for any abnormality Assessment and Plan Assessment & Plan Chronic deep vein thrombosis of the right lower extremity Chronic, non-acute residual thrombus persists in the right lower extremity, confirmed by repeat Doppler. Intermittent severe pain is likely attributable to  the chronic DVT. Hypercoagulable evaluation was negative, and prior anticoagulation was of insufficient duration. Lifelong anticoagulation is not indicated due to absence of an underlying hypercoagulable disorder. Short-term anticoagulation is warranted to reduce risk of progression or recurrence and to address ongoing symptoms. - Prescribed warfarin 5 mg orally daily in the evening. - Sent prescription to  [facility] in [place]. - Instructed her to follow up with her primary care physician for referral to the Coumadin  clinic at [facility] for INR monitoring and dose adjustment. - Advised avoidance of green leafy vegetables due to potential interaction with warfarin; reassured her that the Coumadin  clinic will assist with dietary management. - Instructed her to continue warfarin for at least three months. - Advised that lifelong anticoagulation is not required due to negative hypercoagulable panel. She was advised to call immediately if she has any concerning symptoms in the interval. The patient voices understanding of current disease status and treatment options and is in agreement with the current care plan.  All questions were answered. The patient knows to call the clinic with any problems, questions or concerns. We can certainly see the patient much sooner if necessary.   Disclaimer: This note was dictated with voice recognition software. Similar sounding words can inadvertently be transcribed and may not be corrected upon review.        "
